# Patient Record
Sex: Male | Born: 1970 | Race: White | Hispanic: No | Marital: Married | Smoking: Never smoker
Health system: Southern US, Community
[De-identification: ages and names within clinical notes are randomized; demographics above are authoritative.]

## PROBLEM LIST (undated history)

## (undated) DIAGNOSIS — R55 Syncope and collapse: Secondary | ICD-10-CM

## (undated) DIAGNOSIS — E291 Testicular hypofunction: Secondary | ICD-10-CM

## (undated) DIAGNOSIS — F32A Depression, unspecified: Secondary | ICD-10-CM

## (undated) DIAGNOSIS — E669 Obesity, unspecified: Secondary | ICD-10-CM

## (undated) DIAGNOSIS — F329 Major depressive disorder, single episode, unspecified: Secondary | ICD-10-CM

## (undated) DIAGNOSIS — M7710 Lateral epicondylitis, unspecified elbow: Secondary | ICD-10-CM

## (undated) DIAGNOSIS — G4733 Obstructive sleep apnea (adult) (pediatric): Secondary | ICD-10-CM

## (undated) DIAGNOSIS — R079 Chest pain, unspecified: Secondary | ICD-10-CM

## (undated) HISTORY — DX: Obstructive sleep apnea (adult) (pediatric): G47.33

## (undated) HISTORY — DX: Chest pain, unspecified: R07.9

## (undated) HISTORY — DX: Major depressive disorder, single episode, unspecified: F32.9

## (undated) HISTORY — DX: Depression, unspecified: F32.A

## (undated) HISTORY — DX: Syncope and collapse: R55

## (undated) HISTORY — DX: Obesity, unspecified: E66.9

## (undated) HISTORY — DX: Testicular hypofunction: E29.1

## (undated) HISTORY — DX: Lateral epicondylitis, unspecified elbow: M77.10

---

## 2010-09-29 ENCOUNTER — Emergency Department (HOSPITAL_COMMUNITY): Payer: 59

## 2010-09-29 ENCOUNTER — Emergency Department (HOSPITAL_COMMUNITY)
Admission: EM | Admit: 2010-09-29 | Discharge: 2010-09-29 | Disposition: A | Discharge status: Home / Self Care | Payer: 59 | Attending: Emergency Medicine | Admitting: Emergency Medicine

## 2010-09-29 DIAGNOSIS — R55 Syncope and collapse: Secondary | ICD-10-CM | POA: Insufficient documentation

## 2010-09-29 LAB — CBC
MCHC: 33.7 g/dL (ref 30.0–36.0)
MCV: 85.3 fL (ref 78.0–100.0)
Platelets: 168 10*3/uL (ref 150–400)
RDW: 13.8 % (ref 11.5–15.5)
WBC: 7.9 10*3/uL (ref 4.0–10.5)

## 2010-09-29 LAB — URINALYSIS, ROUTINE W REFLEX MICROSCOPIC
Bilirubin Urine: NEGATIVE
Hgb urine dipstick: NEGATIVE
Ketones, ur: NEGATIVE mg/dL
Nitrite: NEGATIVE
Specific Gravity, Urine: 1.017 (ref 1.005–1.030)
Urine Glucose, Fasting: NEGATIVE mg/dL
pH: 6 (ref 5.0–8.0)

## 2010-09-29 LAB — DIFFERENTIAL
Basophils Absolute: 0 10*3/uL (ref 0.0–0.1)
Eosinophils Absolute: 0.1 10*3/uL (ref 0.0–0.7)
Eosinophils Relative: 1 % (ref 0–5)
Lymphs Abs: 1.3 10*3/uL (ref 0.7–4.0)
Monocytes Absolute: 0.6 10*3/uL (ref 0.1–1.0)

## 2010-09-29 LAB — BASIC METABOLIC PANEL
BUN: 9 mg/dL (ref 6–23)
Calcium: 8.7 mg/dL (ref 8.4–10.5)
GFR calc non Af Amer: >60 mL/min (ref >60)
Glucose, Bld: 125 mg/dL — ABNORMAL HIGH (ref 70–99)
Potassium: 4.1 mEq/L (ref 3.5–5.1)

## 2010-09-29 LAB — POCT CARDIAC MARKERS: Troponin i, poc: <0.05 ng/mL (ref 0.00–0.09)

## 2010-09-29 IMAGING — CT CT HEAD W/O CM
2 series · 16 of 30 positions shown, 20 images · non-contrast
Comparison: None

CLINICAL DATA: Syncope

CT HEAD WITHOUT CONTRAST
TECHNIQUE: Contiguous axial images were obtained from the base of
the skull through the vertex without contrast.

[Series 2: head w/o · axial · non-contrast · 0.52mm/px · z∈[+750,+880]mm · 13 of 32 slices shown, 17 images]
[im 3/32  brain]
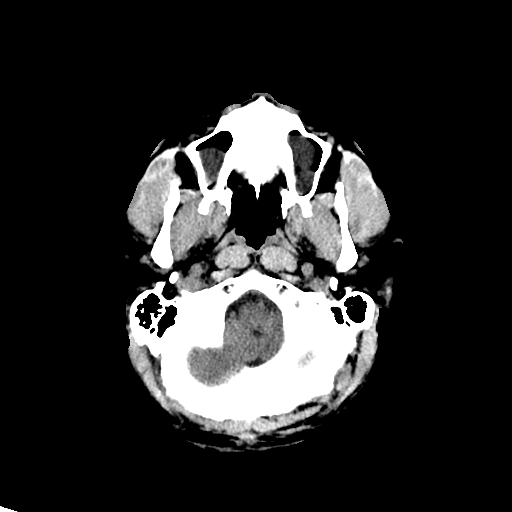
[im 3/32  bone]
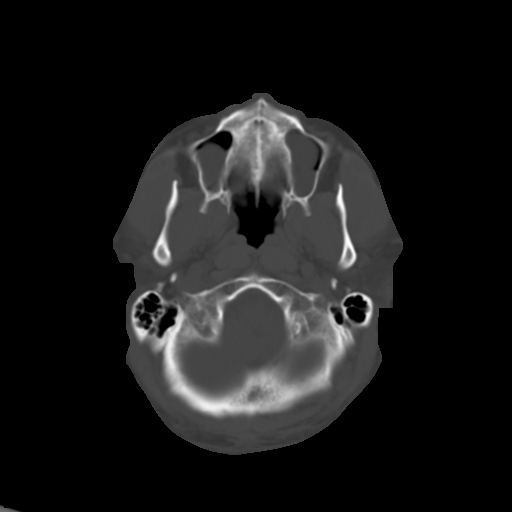
[im 5/32  brain]
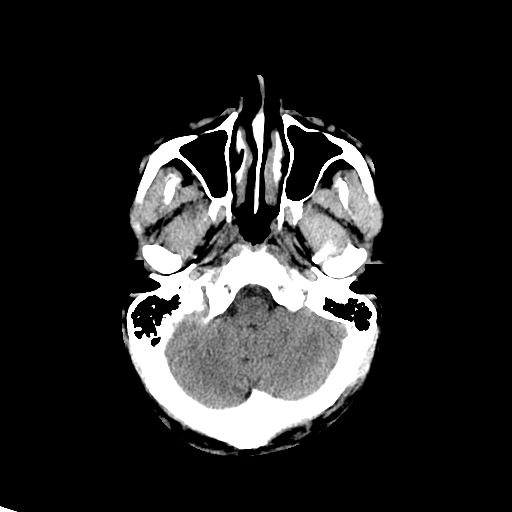
[im 7/32  brain]
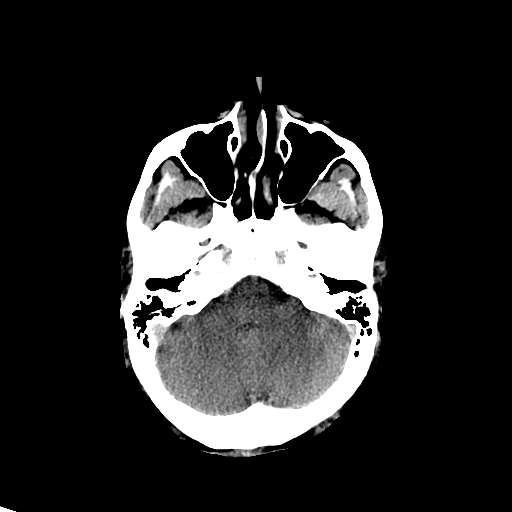
[im 9/32  brain]
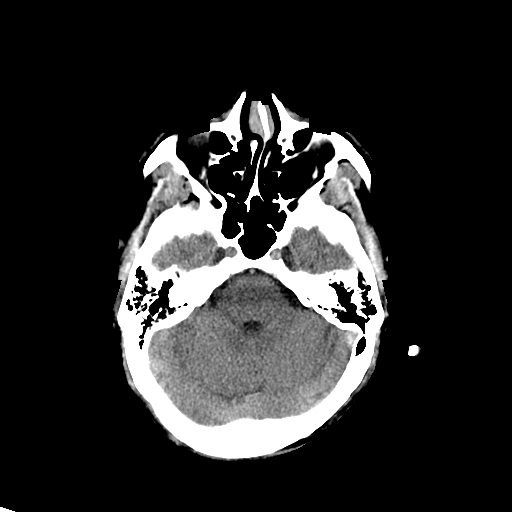
[im 12/32  brain]
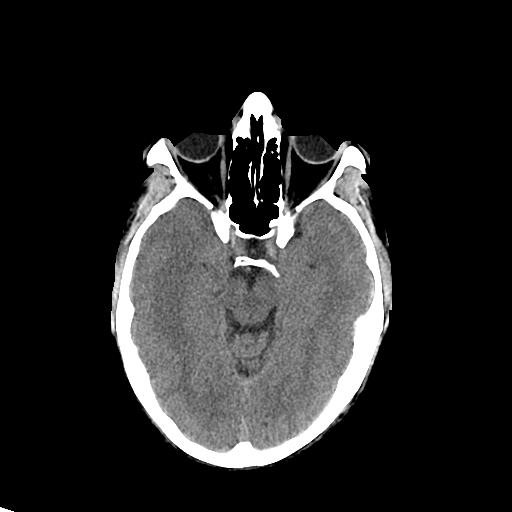
[im 12/32  bone]
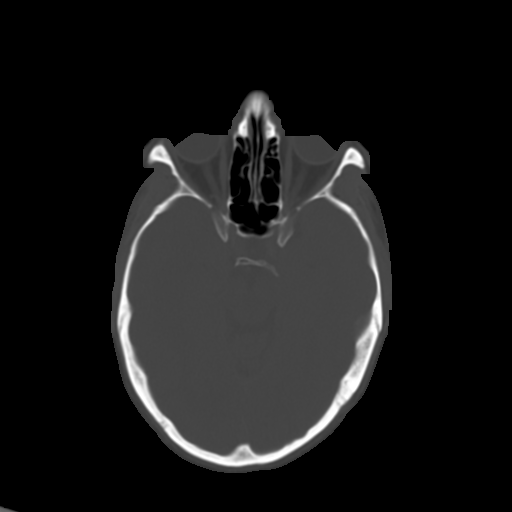
[im 14/32  brain]
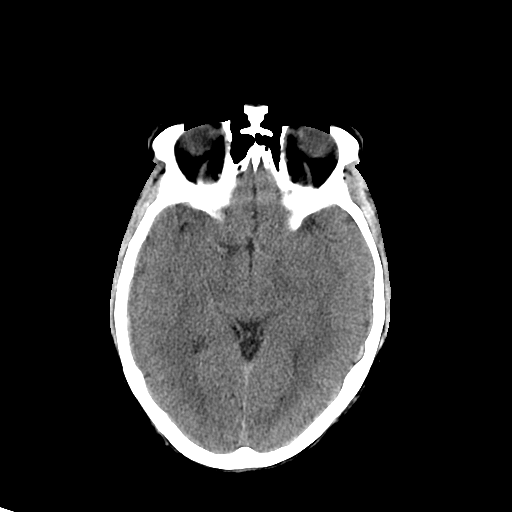
[im 16/32  brain]
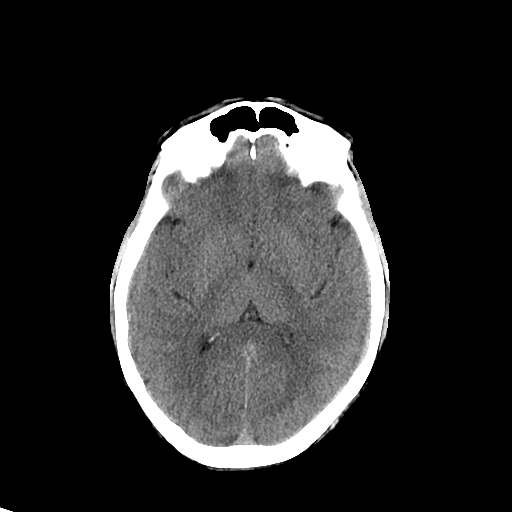
[im 18/32  brain]
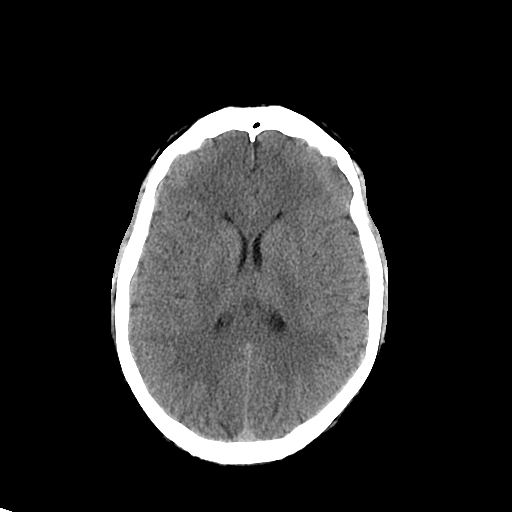
[im 20/32  brain]
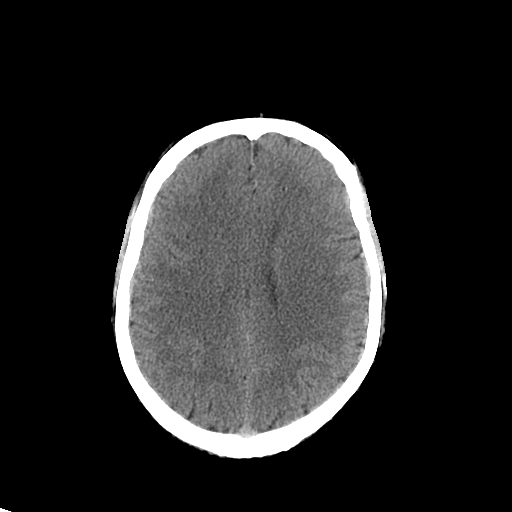
[im 20/32  bone]
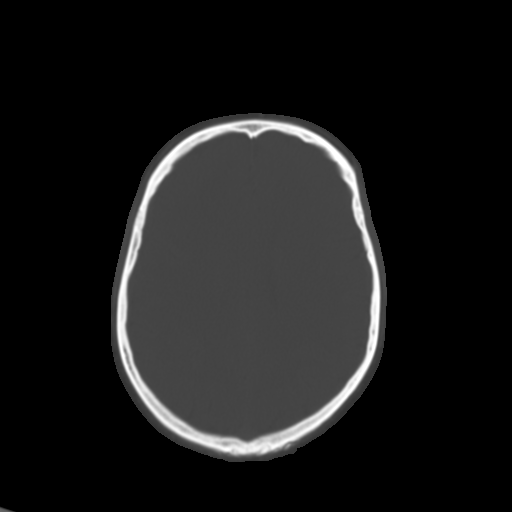
[im 23/32  brain]
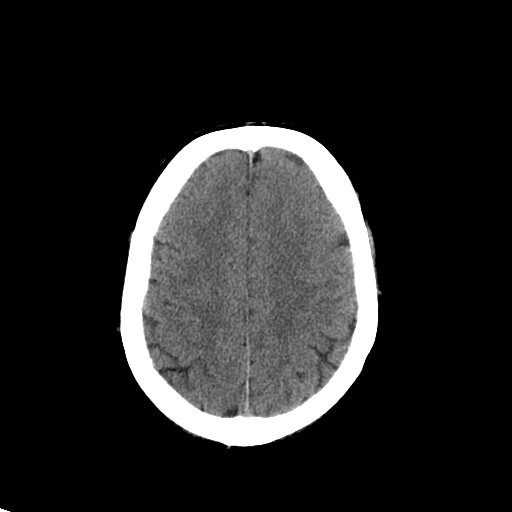
[im 25/32  brain]
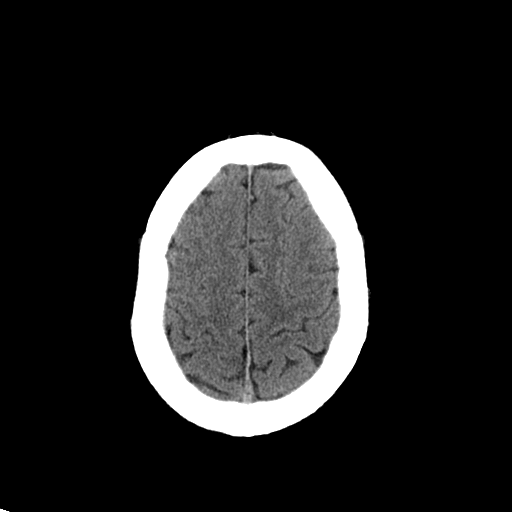
[im 27/32  brain]
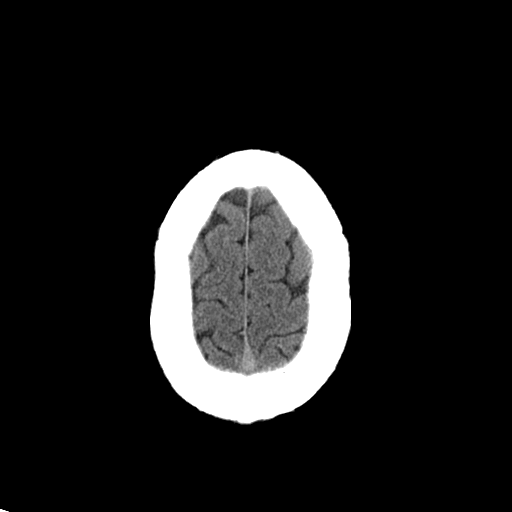
[im 29/32  brain]
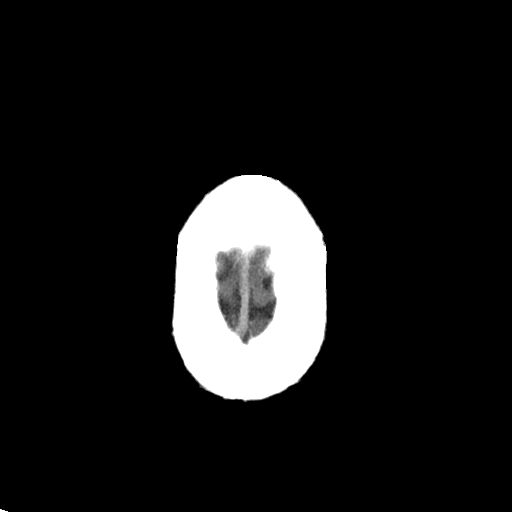
[im 29/32  bone]
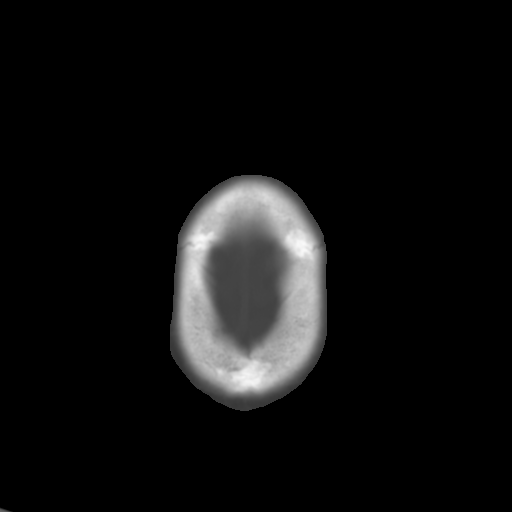

[Series 3: head w/o bone · axial · non-contrast · 0.52mm/px · z∈[+750,+795]mm · 3 of 32 slices shown]
[im 3/32  bone]
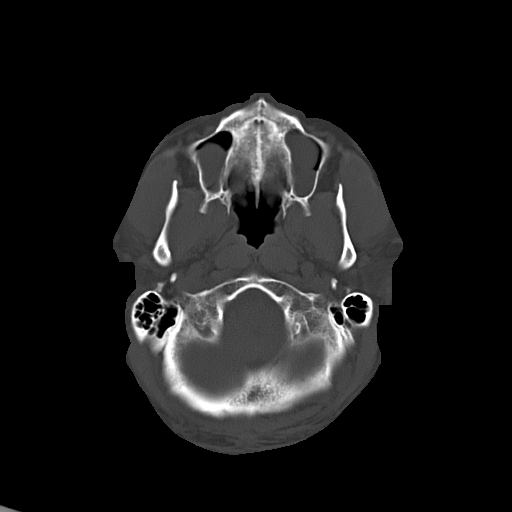
[im 7/32  bone]
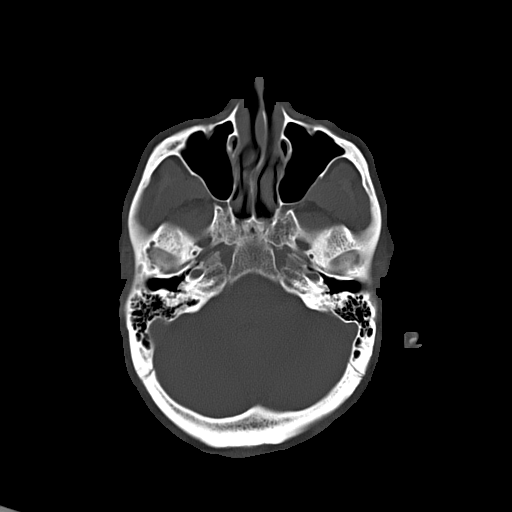
[im 12/32  bone]
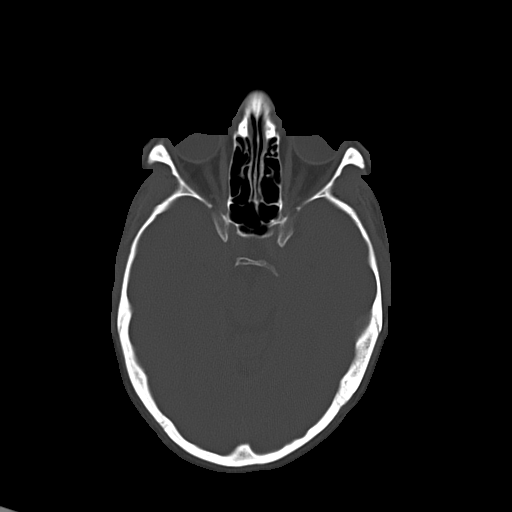

[16 of 30 positions shown; findings below may reference images not displayed]

FINDINGS: The brain has a normal appearance without evidence of
atrophy, old or acute infarction, mass lesion, hemorrhage,
hydrocephalus or extra-axial collection.  The calvarium is
unremarkable.  Sinuses, middle ears and mastoids are clear.
IMPRESSION: Normal head CT

## 2011-10-28 DIAGNOSIS — E291 Testicular hypofunction: Secondary | ICD-10-CM | POA: Insufficient documentation

## 2012-04-09 ENCOUNTER — Encounter: Payer: Self-pay | Admitting: Family

## 2012-04-09 ENCOUNTER — Ambulatory Visit (INDEPENDENT_AMBULATORY_CARE_PROVIDER_SITE_OTHER): Payer: BC Managed Care – PPO | Admitting: Family

## 2012-04-09 VITALS — BP 100/70 | HR 80 | Temp 98.2°F | Ht 73.25 in | Wt 275.0 lb

## 2012-04-09 DIAGNOSIS — R5383 Other fatigue: Secondary | ICD-10-CM

## 2012-04-09 DIAGNOSIS — G4700 Insomnia, unspecified: Secondary | ICD-10-CM

## 2012-04-09 DIAGNOSIS — R5381 Other malaise: Secondary | ICD-10-CM

## 2012-04-09 LAB — CBC WITH DIFFERENTIAL/PLATELET
Basophils Absolute: 0 10*3/uL (ref 0.0–0.1)
Eosinophils Relative: 2.1 % (ref 0.0–5.0)
HCT: 46.5 % (ref 39.0–52.0)
Hemoglobin: 15.3 g/dL (ref 13.0–17.0)
Lymphocytes Relative: 32.7 % (ref 12.0–46.0)
Lymphs Abs: 2.4 10*3/uL (ref 0.7–4.0)
Monocytes Relative: 6.8 % (ref 3.0–12.0)
Neutro Abs: 4.2 10*3/uL (ref 1.4–7.7)
RBC: 5.4 Mil/uL (ref 4.22–5.81)
RDW: 14.3 % (ref 11.5–14.6)
WBC: 7.3 10*3/uL (ref 4.5–10.5)

## 2012-04-09 LAB — BASIC METABOLIC PANEL
BUN: 11 mg/dL (ref 6–23)
CO2: 29 mEq/L (ref 19–32)
Chloride: 104 mEq/L (ref 96–112)
Creatinine, Ser: 1 mg/dL (ref 0.4–1.5)
Glucose, Bld: 83 mg/dL (ref 70–99)
Potassium: 4.1 mEq/L (ref 3.5–5.1)

## 2012-04-09 LAB — LIPID PANEL
Total CHOL/HDL Ratio: 5
VLDL: 51.8 mg/dL — ABNORMAL HIGH (ref 0.0–40.0)

## 2012-04-09 NOTE — Patient Instructions (Addendum)

## 2012-04-09 NOTE — Progress Notes (Signed)
  Subjective:    Patient ID: Joe Francis, male    DOB: Oct 05, 1970, 41 y.o.   MRN: [id number]  HPI 41 year old WM is in today with c/o being moody, fatigue, decreased sexual desire, and insomnia x several months. His wife made this appointment. He has not has a CPX in over 13 years. He has a family history of DM. Patient reports difficulty completing an orgasm. He can achieve an erection but cannot maintain.    Review of Systems  Constitutional: Positive for fatigue.  HENT: Negative.   Respiratory: Negative.   Cardiovascular: Negative.   Gastrointestinal: Negative.   Genitourinary: Negative.        Erectile dysfunction  Musculoskeletal: Negative.   Skin: Negative.   Neurological: Negative.   Hematological: Negative.   Psychiatric/Behavioral: Negative.    Past Medical History  Diagnosis Date  . Fainting spell     History   Social History  . Marital Status: Married    Spouse Name: N/A    Number of Children: N/A  . 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 Smoking status: Never Smoker   . Smokeless tobacco: Not on file  . Alcohol Use: Yes  . Drug Use: Not on file  . Sexually Active: Not on file   Other Topics Concern  . Not on file   Social History Narrative   5-6 hours of sleep per night3 people living in the residence    No past surgical history on file.  Family History  Problem Relation Age of Onset  . Miscarriages / [place] Mother   . Cancer Mother   . Depression Father   . Diabetes Father   . Drug abuse Brother   . Hearing loss Brother   . Learning disabilities Brother   . Stroke Brother   . Cancer Maternal Grandfather   . Arthritis Paternal Grandmother     Not on File  No current outpatient prescriptions on file prior to visit.    BP 100/70  Pulse 80  Temp 98.2 F (36.8 C) (Oral)  Ht 6' 1.25" (1.861 m)  Wt 275 lb (124.739 kg)  BMI 36.03 kg/m2  SpO2 97%chart    Objective:     Physical Exam  Constitutional: He is oriented to person, place, and time. He appears well-developed and well-nourished.  HENT:  Right Ear: External ear normal.  Left Ear: External ear normal.  Nose: Nose normal.  Mouth/Throat: Oropharynx is clear and moist.  Eyes: Conjunctivae are normal. Pupils are equal, round, and reactive to light.  Neck: Normal range of motion. Neck supple.  Cardiovascular: Normal rate, regular rhythm and normal heart sounds.   Pulmonary/Chest: Effort normal and breath sounds normal.  Abdominal: Soft. Bowel sounds are normal.  Musculoskeletal: Normal range of motion.  Neurological: He is alert and oriented to person, place, and time.  Skin: Skin is warm and dry.  Psychiatric: He has a normal mood and affect.          Assessment & Plan:  Assessment: Fatigue, Erectile Dysfunction  Plan: Labs sent to include BMP, lipids, TSH, CBC. Encouraged a healthy diet and exercise. Patient will return for CPX.

## 2012-04-13 ENCOUNTER — Other Ambulatory Visit: Payer: Self-pay | Admitting: Family Medicine

## 2012-04-13 DIAGNOSIS — E7889 Other lipoprotein metabolism disorders: Secondary | ICD-10-CM

## 2012-05-22 ENCOUNTER — Encounter: Payer: Self-pay | Admitting: Family

## 2012-05-22 ENCOUNTER — Other Ambulatory Visit: Payer: Self-pay | Admitting: Family

## 2012-05-22 ENCOUNTER — Ambulatory Visit (INDEPENDENT_AMBULATORY_CARE_PROVIDER_SITE_OTHER): Payer: BC Managed Care – PPO | Admitting: Family

## 2012-05-22 ENCOUNTER — Other Ambulatory Visit: Payer: Self-pay

## 2012-05-22 VITALS — BP 110/80 | HR 78 | Temp 98.1°F | Ht 72.25 in | Wt 275.0 lb

## 2012-05-22 DIAGNOSIS — R5383 Other fatigue: Secondary | ICD-10-CM

## 2012-05-22 DIAGNOSIS — F529 Unspecified sexual dysfunction not due to a substance or known physiological condition: Secondary | ICD-10-CM

## 2012-05-22 DIAGNOSIS — Z0000 Encounter for general adult medical examination without abnormal findings: Secondary | ICD-10-CM

## 2012-05-22 DIAGNOSIS — F5231 Female orgasmic disorder: Secondary | ICD-10-CM

## 2012-05-22 DIAGNOSIS — R5381 Other malaise: Secondary | ICD-10-CM

## 2012-05-22 DIAGNOSIS — E785 Hyperlipidemia, unspecified: Secondary | ICD-10-CM

## 2012-05-22 DIAGNOSIS — Z23 Encounter for immunization: Secondary | ICD-10-CM

## 2012-05-22 LAB — LIPID PANEL
Cholesterol: 196 mg/dL (ref 0–200)
LDL Cholesterol: 125 mg/dL — ABNORMAL HIGH (ref 0–99)
Triglycerides: 184 mg/dL — ABNORMAL HIGH (ref 0.0–149.0)
VLDL: 36.8 mg/dL (ref 0.0–40.0)

## 2012-05-22 LAB — TESTOSTERONE: Testosterone: 199.16 ng/dL — ABNORMAL LOW (ref 350.00–890.00)

## 2012-05-22 MED ORDER — TESTOSTERONE 20.25 MG/1.25GM (1.62%) TD GEL: 2.0000 | Freq: Every day | TRANSDERMAL | Status: DC

## 2012-05-22 NOTE — Addendum Note (Signed)
Addended by: Beverely Low on: 05/22/2012 09:38 AM   Modules accepted: Orders

## 2012-05-22 NOTE — Progress Notes (Signed)
Subjective:    Patient ID: Joe Francis, male    DOB: 08-Nov-1970, 41 y.o.   MRN: [id number]  HPI Patient presents for yearly preventative medicine examination. All immunizations and health maintenance protocols were reviewed with the patient and they are up to date with these protocols. Screening laboratory values were reviewed with the patient including screening of hyperlipidemia PSA renal function and hepatic function. There medications past medical history social history problem list and allergies were reviewed in detail. Goals were established with regard to weight loss exercise diet in compliance with medications  Patient c/o fatigue and difficulty completing and erection. He is growing frustrated with this. His symptoms have not improved.   Review of Systems  Constitutional: Positive for fatigue.  HENT: Negative.   Eyes: Negative.   Respiratory: Negative.   Cardiovascular: Negative.   Gastrointestinal: Negative.   Genitourinary: Negative.   Musculoskeletal: Negative.   Skin: Negative.   Neurological: Negative.   Hematological: Negative.   Psychiatric/Behavioral: Negative.    Past Medical History  Diagnosis Date  . Fainting spell     History   Social History  . Marital Status: Married    Spouse Name: N/A    Number of Children: N/A  . 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 Smoking status: Never Smoker   . Smokeless tobacco: Not on file  . Alcohol Use: Yes  . Drug Use: Not on file  . Sexually Active: Not on file   Other Topics Concern  . Not on file   Social History Narrative   5-6 hours of sleep per night3 people living in the residence    No past surgical history on file.  Family History  Problem Relation Age of Onset  . Miscarriages / [place] Mother   . Cancer Mother   . Depression Father   . Diabetes Father   . Drug abuse Brother   . Hearing loss Brother   . Learning disabilities  Brother   . Stroke Brother   . Cancer Maternal Grandfather   . Arthritis Paternal Grandmother     Not on File  No current outpatient prescriptions on file prior to visit.    BP 110/80  Pulse 78  Temp 98.1 F (36.7 C) (Oral)  Ht 6' 0.25" (1.835 m)  Wt 275 lb (124.739 kg)  BMI 37.04 kg/m2  SpO2 93%chart    Objective:   Physical Exam  Constitutional: He is oriented to person, place, and time. He appears well-developed and well-nourished.  HENT:  Head: Normocephalic and atraumatic.  Right Ear: External ear normal.  Left Ear: External ear normal.  Nose: Nose normal.  Mouth/Throat: Oropharynx is clear and moist.  Eyes: Conjunctivae normal and EOM are normal. Pupils are equal, round, and reactive to light.  Neck: Normal range of motion. Neck supple. No thyromegaly present.  Cardiovascular: Normal rate, regular rhythm, normal heart sounds and intact distal pulses.  Exam reveals no gallop and no friction rub.   No murmur heard. Pulmonary/Chest: Effort normal and breath sounds normal.  Abdominal: Soft. Bowel sounds are normal.  Genitourinary: Rectum normal, prostate normal and penis normal. Guaiac negative stool. No penile tenderness.  Musculoskeletal: Normal range of motion.  Neurological: He is alert and oriented to person, place, and time. He displays normal reflexes. No cranial nerve deficit. Coordination normal.  Skin: Skin is warm and dry.  Psychiatric: He has a normal mood and affect.  Assessment & Plan:  Assessment: CPX, Fatigue, Orgasm Disorder  Plan: Labs sent to include lipids, testosterone. Encouraged a healthy diet and exercise.

## 2012-06-29 ENCOUNTER — Telehealth: Payer: Self-pay | Admitting: Family

## 2012-06-29 DIAGNOSIS — E291 Testicular hypofunction: Secondary | ICD-10-CM

## 2012-06-29 NOTE — Telephone Encounter (Signed)
Pt called and said that the Testosterone (ANDROGEL) 20.25 MG/1.25GM (1.62%) GEL doesn't seem to be working. Pt wondering if he needs to come in to get lvls checked?

## 2012-06-29 NOTE — Telephone Encounter (Signed)
Please advise 

## 2012-06-30 NOTE — Telephone Encounter (Signed)
Yes please schedule for patient to have testosterone checked.

## 2012-06-30 NOTE — Telephone Encounter (Signed)
appt scheduled

## 2012-07-06 ENCOUNTER — Other Ambulatory Visit: Payer: Self-pay | Admitting: Family

## 2012-07-06 ENCOUNTER — Other Ambulatory Visit (INDEPENDENT_AMBULATORY_CARE_PROVIDER_SITE_OTHER): Payer: BC Managed Care – PPO

## 2012-07-06 DIAGNOSIS — E291 Testicular hypofunction: Secondary | ICD-10-CM

## 2012-07-06 MED ORDER — TESTOSTERONE CYPIONATE 200 MG/ML IM SOLN: 200.0000 mg | INTRAMUSCULAR | Status: DC

## 2012-07-07 ENCOUNTER — Telehealth: Payer: Self-pay | Admitting: Family

## 2012-07-07 NOTE — Telephone Encounter (Signed)
Pt called and said that he was returning call from yesterday re: lab results. Pls call.

## 2012-07-07 NOTE — Telephone Encounter (Signed)
See result note.  

## 2012-07-09 ENCOUNTER — Telehealth: Payer: Self-pay | Admitting: Family Medicine

## 2012-07-09 NOTE — Telephone Encounter (Signed)
Left message to notify pt, per Padonda, pt can do 100mg  q 7 days instead of 200mg  q 14 days. Advised pt to call with questions or concerns

## 2012-07-09 NOTE — Telephone Encounter (Signed)
Doug's testosterone injection are approved. He is wondering if he can inject 100mg  every 7 days, instead of 200mg  every 14 days. He didn't know if that would level in out in a more steady manner. Please call him and let him know. Thank you.

## 2012-08-28 ENCOUNTER — Encounter: Payer: Self-pay | Admitting: Family

## 2012-08-28 ENCOUNTER — Ambulatory Visit (INDEPENDENT_AMBULATORY_CARE_PROVIDER_SITE_OTHER): Payer: BC Managed Care – PPO | Admitting: Family

## 2012-08-28 VITALS — BP 108/80 | HR 81 | Wt 282.0 lb

## 2012-08-28 DIAGNOSIS — E291 Testicular hypofunction: Secondary | ICD-10-CM

## 2012-08-28 DIAGNOSIS — R5383 Other fatigue: Secondary | ICD-10-CM

## 2012-08-28 DIAGNOSIS — N529 Male erectile dysfunction, unspecified: Secondary | ICD-10-CM

## 2012-08-28 LAB — COMPREHENSIVE METABOLIC PANEL
ALT: 30 U/L (ref 0–53)
CO2: 26 mEq/L (ref 19–32)
Calcium: 9.1 mg/dL (ref 8.4–10.5)
Chloride: 106 mEq/L (ref 96–112)
GFR: 72.88 mL/min (ref >60.00)
Glucose, Bld: 97 mg/dL (ref 70–99)
Sodium: 140 mEq/L (ref 135–145)
Total Protein: 7.6 g/dL (ref 6.0–8.3)

## 2012-08-28 LAB — TESTOSTERONE: Testosterone: 438.43 ng/dL (ref 350.00–890.00)

## 2012-08-28 MED ORDER — TESTOSTERONE CYPIONATE 200 MG/ML IM SOLN: 200.0000 mg | INTRAMUSCULAR | Status: DC

## 2012-08-28 NOTE — Progress Notes (Signed)
  Subjective:    Patient ID: Joe Francis, male    DOB: 1970-11-04, 42 y.o.   MRN: [id number]  HPI 42 year old WM, nonsmoker, is in for recheck of hypogonadism. Patient has improved overall. He has taken 100 mg of testosterone once a week on Sundays. By Tuesday, patient says that he feels the best and is able to perform sexually well by Tuesday. By Thursday he is unable to perform again until his next dosage. He has difficulty achieving orgasm most of the time. But that has improved from never. Continues to have fatigue that is also significantly improved.   Review of Systems  Constitutional: Positive for fatigue.  HENT: Negative.   Respiratory: Negative.   Cardiovascular: Negative.   Gastrointestinal: Negative.   Genitourinary: Negative.        Erectile dysfunction   Musculoskeletal: Negative.   Skin: Negative.   Neurological: Negative.   Psychiatric/Behavioral: Negative.    Past Medical History  Diagnosis Date  . Fainting spell     History   Social History  . Marital Status: Married    Spouse Name: N/A    Number of Children: N/A  . 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 Smoking status: Never Smoker   . Smokeless tobacco: Not on file  . Alcohol Use: Yes  . Drug Use: Not on file  . Sexually Active: Not on file   Other Topics Concern  . Not on file   Social History Narrative   5-6 hours of sleep per night3 people living in the residence    No past surgical history on file.  Family History  Problem Relation Age of Onset  . Miscarriages / [place] Mother   . Cancer Mother   . Depression Father   . Diabetes Father   . Drug abuse Brother   . Hearing loss Brother   . Learning disabilities Brother   . Stroke Brother   . Cancer Maternal Grandfather   . Arthritis Paternal Grandmother     Not on File  Current Outpatient Prescriptions on File Prior to Visit  Medication Sig Dispense Refill  .  testosterone cypionate (DEPOTESTOTERONE CYPIONATE) 200 MG/ML injection Inject 1 mL (200 mg total) into the muscle once a week.  10 mL  0    BP 108/80  Pulse 81  Wt 282 lb (127.914 kg)  SpO2 98%chart    Objective:   Physical Exam  Constitutional: He is oriented to person, place, and time. He appears well-developed and well-nourished.  Neck: Normal range of motion. Neck supple.  Cardiovascular: Normal rate, regular rhythm and normal heart sounds.   Pulmonary/Chest: Effort normal and breath sounds normal.  Abdominal: Soft. Bowel sounds are normal.  Musculoskeletal: Normal range of motion.  Neurological: He is alert and oriented to person, place, and time.  Skin: Skin is warm and dry.  Psychiatric: He has a normal mood and affect.          Assessment & Plan:  Assessment: Hypogonadism, fatigue, erectile dysfunction  Plan: Testosterone level sent. Consider increasing testosterone to 200 mg once a week x4 weeks then biweekly Contingent upon the results of his labs. we'll keep a close eye on his testosterone since he's had difficulty for several months.

## 2012-08-28 NOTE — Patient Instructions (Addendum)
Testosterone This test is used to determine if your testosterone level is abnormal. This could be used to explain difficulty getting an erection (erectile dysfunction), inability of your partner to get pregnant (infertility), premature or delayed puberty if you are male, or the appearance of masculine physical features if you are male. PREPARATION FOR TEST A blood sample is obtained by inserting a needle into a vein in the arm. NORMAL FINDINGS  Free Testosterone: 0.3-2 pg/mL  % Free Testosterone: 0.1%-0.3% Total Testosterone:  7 mos-9 yrs (Tanner Stage I)  Male: Less than 30 ng/dL  Male: Less than 30 ng/dL  16-10 yrs (Tanner Stage II)  Male: Less than 300 ng/dL  Male: Less than 40 ng/dL  96-04 yrs (Tanner Stage III)  Male: 170-540 ng/dL  Male: Less than 60 ng/dL  54-09 yrs (Tanner Stage IV, V)  Male: 250-910 ng/dL  Male: Less than 70 ng/dL  20 yrs and over  Male: [phone] ng/dL  Male: Less than 70 ng/dL Ranges for normal findings may vary among different laboratories and hospitals. You should always check with your doctor after having lab work or other tests done to discuss the meaning of your test results and whether your values are considered within normal limits. MEANING OF TEST  Your caregiver will go over the test results with you and discuss the importance and meaning of your results, as well as treatment options and the need for additional tests if necessary. OBTAINING THE TEST RESULTS It is your responsibility to obtain your test results. Ask the lab or department performing the test when and how you will get your results. Document Released: 08/29/2004 Document Revised: 11/04/2011 Document Reviewed: 07/24/2008 [facility] Patient Information 2013 [place], [place].

## 2012-10-09 ENCOUNTER — Other Ambulatory Visit: Payer: BC Managed Care – PPO

## 2012-10-12 ENCOUNTER — Other Ambulatory Visit (INDEPENDENT_AMBULATORY_CARE_PROVIDER_SITE_OTHER): Payer: BC Managed Care – PPO

## 2012-10-12 DIAGNOSIS — R5383 Other fatigue: Secondary | ICD-10-CM

## 2012-10-12 DIAGNOSIS — E789 Disorder of lipoprotein metabolism, unspecified: Secondary | ICD-10-CM

## 2012-10-12 DIAGNOSIS — E7889 Other lipoprotein metabolism disorders: Secondary | ICD-10-CM

## 2012-10-12 DIAGNOSIS — N529 Male erectile dysfunction, unspecified: Secondary | ICD-10-CM

## 2012-10-12 DIAGNOSIS — E291 Testicular hypofunction: Secondary | ICD-10-CM

## 2012-10-12 LAB — TESTOSTERONE: Testosterone: 891.87 ng/dL — ABNORMAL HIGH (ref 350.00–890.00)

## 2012-10-12 LAB — LIPID PANEL: Total CHOL/HDL Ratio: 5

## 2012-10-27 ENCOUNTER — Encounter: Payer: Self-pay | Admitting: Family

## 2012-10-27 ENCOUNTER — Ambulatory Visit (INDEPENDENT_AMBULATORY_CARE_PROVIDER_SITE_OTHER): Payer: BC Managed Care – PPO | Admitting: Family

## 2012-10-27 VITALS — BP 120/80 | HR 64 | Wt 288.0 lb

## 2012-10-27 DIAGNOSIS — M7711 Lateral epicondylitis, right elbow: Secondary | ICD-10-CM

## 2012-10-27 DIAGNOSIS — M7710 Lateral epicondylitis, unspecified elbow: Secondary | ICD-10-CM

## 2012-10-27 DIAGNOSIS — E291 Testicular hypofunction: Secondary | ICD-10-CM

## 2012-10-27 MED ORDER — METHYLPREDNISOLONE 4 MG PO KIT: PACK | ORAL | Status: AC

## 2012-10-27 MED ORDER — TESTOSTERONE CYPIONATE 200 MG/ML IM SOLN: 100.0000 mg | INTRAMUSCULAR | Status: DC

## 2012-10-27 NOTE — Progress Notes (Signed)
  Subjective:    Patient ID: Joe Francis, male    DOB: [birth year], 42 y.o.   MRN: [id number]  HPI 42 year old white male, nonsmoker is in today with bilateral elbow pain x2 weeks. He denies any increase in repetitive movement. However, he does weight lift every day. Reports bench pressing 275 pounds tricep press 90 pounds. Reports pain 6-7/10, worse with movement. Has been taken over-the-counter Motrin that has not relieved the pain. Denies any changes in his exercise regimen.  Patient currently doing well on testosterone injections. Reports feeling significantly better.  Review of Systems  Constitutional: Negative.   Respiratory: Negative.   Cardiovascular: Negative.   Gastrointestinal: Negative.   Musculoskeletal: Positive for joint swelling.       Elbow pain  Skin: Negative.   Neurological: Negative.   Hematological: Negative.   Psychiatric/Behavioral: Negative.    Past Medical History  Diagnosis Date  . Fainting spell     History   Social History  . Marital Status: Married    Spouse Name: N/A    Number of Children: N/A  . 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 Smoking status: Never Smoker   . Smokeless tobacco: Not on file  . Alcohol Use: Yes  . Drug Use: Not on file  . Sexually Active: Not on file   Other Topics Concern  . Not on file   Social History Narrative   5-6 hours of sleep per night   3 people living in the residence    History reviewed. No pertinent past surgical history.  Family History  Problem Relation Age of Onset  . Miscarriages / [place] Mother   . Cancer Mother   . Depression Father   . Diabetes Father   . Drug abuse Brother   . Hearing loss Brother   . Learning disabilities Brother   . Stroke Brother   . Cancer Maternal Grandfather   . Arthritis Paternal Grandmother     Not on File  No current outpatient prescriptions on file prior to visit.   No current  facility-administered medications on file prior to visit.    BP 120/80  Pulse 64  Wt 288 lb (130.636 kg)  BMI 38.8 kg/m2  SpO2 98%chart    Objective:   Physical Exam  Constitutional: He is oriented to person, place, and time. He appears well-developed and well-nourished.  Neck: Normal range of motion. Neck supple.  Cardiovascular: Normal rate, regular rhythm and normal heart sounds.   Pulmonary/Chest: Effort normal and breath sounds normal.  Musculoskeletal: He exhibits tenderness.  Left elbow-moderate tenderness to palpation of the lateral epicondyle. Mild swelling. Right elbow mild tenderness to palpation of the lateral epicondyle. No swelling.  Neurological: He is alert and oriented to person, place, and time.  Skin: Skin is warm and dry.  Psychiatric: He has a normal mood and affect.          Assessment & Plan:  Assessment:  1. Tennis elbow bilaterally 2. Hypogonadism  Plan: Medrol Dosepak as directed. Rest x1 week. Ice to both elbows nightly. Call the office if symptoms worsen or persist. Recheck as scheduled, and as needed.

## 2012-10-27 NOTE — Patient Instructions (Addendum)
Lateral Epicondylitis (Tennis Elbow) with Rehab Lateral epicondylitis involves inflammation and pain around the outer portion of the elbow. The pain is caused by inflammation of the tendons in the forearm that bring back (extend) the wrist. Lateral epicondylittis is also called tennis elbow, because it is very common in tennis players. However, it may occur in any individual who extends the wrist repetitively. If lateral epicondylitis is left untreated, it may become a chronic problem. SYMPTOMS   Pain, tenderness, and inflammation on the outer (lateral) side of the elbow.  Pain or weakness with gripping activities.  Pain that increases with wrist twisting motions (playing tennis, using a screwdriver, opening a door or a jar).  Pain with lifting objects, including a coffee cup. CAUSES  Lateral epicondylitis is caused by inflammation of the tendons that extend the wrist. Causes of injury may include:  Repetitive stress and strain on the muscles and tendons that extend the wrist.  Sudden change in activity level or intensity.  Incorrect grip in racquet sports.  Incorrect grip size of racquet (often too large).  Incorrect hitting position or technique (usually backhand, leading with the elbow).  Using a racket that is too heavy. RISK INCREASES WITH:  Sports or occupations that require repetitive and/or strenuous forearm and wrist movements (tennis, squash, racquetball, carpentry).  Poor wrist and forearm strength and flexibility.  Failure to warm up properly before activity.  Resuming activity before healing, rehabilitation, and conditioning are complete. PREVENTION   Warm up and stretch properly before activity.  Maintain physical fitness:  Strength, flexibility, and endurance.  Cardiovascular fitness.  Wear and use properly fitted equipment.  Learn and use proper technique and have a coach correct improper technique.  Wear a tennis elbow (counterforce) brace. PROGNOSIS   The course of this condition depends on the degree of the injury. If treated properly, acute cases (symptoms lasting less than 4 weeks) are often resolved in 2 to 6 weeks. Chronic (longer lasting cases) often resolve in 3 to 6 months, but may require physical therapy. RELATED COMPLICATIONS   Frequently recurring symptoms, resulting in a chronic problem. Properly treating the problem the first time decreases frequency of recurrence.  Chronic inflammation, scarring tendon degeneration, and partial tendon tear, requiring surgery.  Delayed healing or resolution of symptoms. TREATMENT  Treatment first involves the use of ice and medicine, to reduce pain and inflammation. Strengthening and stretching exercises may help reduce discomfort, if performed regularly. These exercises may be performed at home, if the condition is an acute injury. Chronic cases may require a referral to a physical therapist for evaluation and treatment. Your caregiver may advise a corticosteroid injection, to help reduce inflammation. Rarely, surgery is needed. MEDICATION  If pain medicine is needed, nonsteroidal anti-inflammatory medicines (aspirin and ibuprofen), or other minor pain relievers (acetaminophen), are often advised.  Do not take pain medicine for 7 days before surgery.  Prescription pain relievers may be given, if your caregiver thinks they are needed. Use only as directed and only as much as you need.  Corticosteroid injections may be recommended. These injections should be reserved only for the most severe cases, because they can only be given a certain number of times. HEAT AND COLD  Cold treatment (icing) should be applied for 10 to 15 minutes every 2 to 3 hours for inflammation and pain, and immediately after activity that aggravates your symptoms. Use ice packs or an ice massage.  Heat treatment may be used before performing stretching and strengthening activities prescribed by your   caregiver, physical  therapist, or athletic trainer. Use a heat pack or a warm water soak. SEEK MEDICAL CARE IF: Symptoms get worse or do not improve in 2 weeks, despite treatment. EXERCISES  RANGE OF MOTION (ROM) AND STRETCHING EXERCISES - Epicondylitis, Lateral (Tennis Elbow) These exercises may help you when beginning to rehabilitate your injury. Your symptoms may go away with or without further involvement from your physician, physical therapist or athletic trainer. While completing these exercises, remember:   Restoring tissue flexibility helps normal motion to return to the joints. This allows healthier, less painful movement and activity.  An effective stretch should be held for at least 30 seconds.  A stretch should never be painful. You should only feel a gentle lengthening or release in the stretched tissue. RANGE OF MOTION  Wrist Flexion, Active-Assisted  Extend your right / left elbow with your fingers pointing down.*  Gently pull the back of your hand towards you, until you feel a gentle stretch on the top of your forearm.  Hold this position for __________ seconds. Repeat __________ times. Complete this exercise __________ times per day.  *If directed by your physician, physical therapist or athletic trainer, complete this stretch with your elbow bent, rather than extended. RANGE OF MOTION  Wrist Extension, Active-Assisted  Extend your right / left elbow and turn your palm upwards.*  Gently pull your palm and fingertips back, so your wrist extends and your fingers point more toward the ground.  You should feel a gentle stretch on the inside of your forearm.  Hold this position for __________ seconds. Repeat __________ times. Complete this exercise __________ times per day. *If directed by your physician, physical therapist or athletic trainer, complete this stretch with your elbow bent, rather than extended. STRETCH - Wrist Flexion  Place the back of your right / left hand on a tabletop,  leaving your elbow slightly bent. Your fingers should point away from your body.  Gently press the back of your hand down onto the table by straightening your elbow. You should feel a stretch on the top of your forearm.  Hold this position for __________ seconds. Repeat __________ times. Complete this stretch __________ times per day.  STRETCH  Wrist Extension   Place your right / left fingertips on a tabletop, leaving your elbow slightly bent. Your fingers should point backwards.  Gently press your fingers and palm down onto the table by straightening your elbow. You should feel a stretch on the inside of your forearm.  Hold this position for __________ seconds. Repeat __________ times. Complete this stretch __________ times per day.  STRENGTHENING EXERCISES - Epicondylitis, Lateral (Tennis Elbow) These exercises may help you when beginning to rehabilitate your injury. They may resolve your symptoms with or without further involvement from your physician, physical therapist or athletic trainer. While completing these exercises, remember:   Muscles can gain both the endurance and the strength needed for everyday activities through controlled exercises.  Complete these exercises as instructed by your physician, physical therapist or athletic trainer. Increase the resistance and repetitions only as guided.  You may experience muscle soreness or fatigue, but the pain or discomfort you are trying to eliminate should never worsen during these exercises. If this pain does get worse, stop and make sure you are following the directions exactly. If the pain is still present after adjustments, discontinue the exercise until you can discuss the trouble with your caregiver. STRENGTH Wrist Flexors  Sit with your right / left forearm palm-up and   fully supported on a table or countertop. Your elbow should be resting below the height of your shoulder. Allow your wrist to extend over the edge of the  surface.  Loosely holding a __________ weight, or a piece of rubber exercise band or tubing, slowly curl your hand up toward your forearm.  Hold this position for __________ seconds. Slowly lower the wrist back to the starting position in a controlled manner. Repeat __________ times. Complete this exercise __________ times per day.  STRENGTH  Wrist Extensors  Sit with your right / left forearm palm-down and fully supported on a table or countertop. Your elbow should be resting below the height of your shoulder. Allow your wrist to extend over the edge of the surface.  Loosely holding a __________ weight, or a piece of rubber exercise band or tubing, slowly curl your hand up toward your forearm.  Hold this position for __________ seconds. Slowly lower the wrist back to the starting position in a controlled manner. Repeat __________ times. Complete this exercise __________ times per day.  STRENGTH - Ulnar Deviators  Stand with a ____________________ weight in your right / left hand, or sit while holding a rubber exercise band or tubing, with your healthy arm supported on a table or countertop.  Move your wrist, so that your pinkie travels toward your forearm and your thumb moves away from your forearm.  Hold this position for __________ seconds and then slowly lower the wrist back to the starting position. Repeat __________ times. Complete this exercise __________ times per day STRENGTH - Radial Deviators  Stand with a ____________________ weight in your right / left hand, or sit while holding a rubber exercise band or tubing, with your injured arm supported on a table or countertop.  Raise your hand upward in front of you or pull up on the rubber tubing.  Hold this position for __________ seconds and then slowly lower the wrist back to the starting position. Repeat __________ times. Complete this exercise __________ times per day. STRENGTH  Forearm Supinators   Sit with your right /  left forearm supported on a table, keeping your elbow below shoulder height. Rest your hand over the edge, palm down.  Gently grip a hammer or a soup ladle.  Without moving your elbow, slowly turn your palm and hand upward to a "thumbs-up" position.  Hold this position for __________ seconds. Slowly return to the starting position. Repeat __________ times. Complete this exercise __________ times per day.  STRENGTH  Forearm Pronators   Sit with your right / left forearm supported on a table, keeping your elbow below shoulder height. Rest your hand over the edge, palm up.  Gently grip a hammer or a soup ladle.  Without moving your elbow, slowly turn your palm and hand upward to a "thumbs-up" position.  Hold this position for __________ seconds. Slowly return to the starting position. Repeat __________ times. Complete this exercise __________ times per day.  STRENGTH - Grip  Grasp a tennis ball, a dense sponge, or a large, rolled sock in your hand.  Squeeze as hard as you can, without increasing any pain.  Hold this position for __________ seconds. Release your grip slowly. Repeat __________ times. Complete this exercise __________ times per day.  STRENGTH - Elbow Extensors, Isometric  Stand or sit upright, on a firm surface. Place your right / left arm so that your palm faces your stomach, and it is at the height of your waist.  Place your opposite hand on   the underside of your forearm. Gently push up as your right / left arm resists. Push as hard as you can with both arms, without causing any pain or movement at your right / left elbow. Hold this stationary position for __________ seconds. Gradually release the tension in both arms. Allow your muscles to relax completely before repeating. Document Released: 08/12/2005 Document Revised: 11/04/2011 Document Reviewed: 11/24/2008 Exit[employer] Patient Information 2013 [employer].  

## 2013-01-11 ENCOUNTER — Encounter: Payer: Self-pay | Admitting: Family

## 2013-01-11 ENCOUNTER — Ambulatory Visit (INDEPENDENT_AMBULATORY_CARE_PROVIDER_SITE_OTHER): Payer: BC Managed Care – PPO | Admitting: Family

## 2013-01-11 VITALS — BP 122/80 | HR 70 | Temp 97.9°F | Wt 280.0 lb

## 2013-01-11 DIAGNOSIS — R5381 Other malaise: Secondary | ICD-10-CM

## 2013-01-11 DIAGNOSIS — M7710 Lateral epicondylitis, unspecified elbow: Secondary | ICD-10-CM

## 2013-01-11 DIAGNOSIS — M7711 Lateral epicondylitis, right elbow: Secondary | ICD-10-CM

## 2013-01-11 DIAGNOSIS — E291 Testicular hypofunction: Secondary | ICD-10-CM

## 2013-01-11 LAB — TESTOSTERONE: Testosterone: 303.69 ng/dL — ABNORMAL LOW (ref 350.00–890.00)

## 2013-01-11 MED ORDER — DICLOFENAC SODIUM 75 MG PO TBEC: 75.0000 mg | DELAYED_RELEASE_TABLET | Freq: Two times a day (BID) | ORAL | Status: DC

## 2013-01-11 NOTE — Patient Instructions (Signed)
Tennis Elbow  Your caregiver has diagnosed you with a condition often referred to as "tennis elbow." This results from small tears or soreness (inflammation) at the start (origin) of the extensor muscles of the forearm. Although the condition is often called tennis or golfer's elbow, it is caused by any repetitive action performed by your elbow.  HOME CARE INSTRUCTIONS   If the condition has been short lived, rest may be the only treatment required. Using your opposite hand or arm to perform the task may help. Even changing your grip may help rest the extremity. These may even prevent the condition from recurring.   Longer standing problems, however, will often be relieved faster by:   Using anti-inflammatory agents.   Applying ice packs for 30 minutes at the end of the working day, at bed time, or when activities are finished.   Your caregiver may also have you wear a splint or sling. This will allow the inflamed tendon to heal.  At times, steroid injections aided with a local anesthetic will be required along with splinting for 1 to 2 weeks. Two to three steroid injections will often solve the problem. In some long standing cases, the inflamed tendon does not respond to conservative (non-surgical) therapy. Then surgery may be required to repair it.  MAKE SURE YOU:    Understand these instructions.   Will watch your condition.   Will get help right away if you are not doing well or get worse.  Document Released: 08/12/2005 Document Revised: 11/04/2011 Document Reviewed: 03/30/2008  [employer] Patient Information 2013 [employer].

## 2013-01-11 NOTE — Progress Notes (Signed)
Subjective:    Patient ID: Joe Francis, male    DOB: [birth year], 42 y.o.   MRN: [id number]  HPI 42 year old WM, nonsmoker, is in today with c/o right elbow pain x several weeks without improvement. He has been applying ice that helps. Rates pain 4/10, worse with lifting or doing pushups. He routinely exercises and lifts weights but has been conservative in his weightlifting since his elbow began to trouble him.   Patient also has a history of hypogonadism. He was doing very well while taking testosterone injections biweekly. Since we decreased to once a month, his energy level is decreasing and libido is low. Would like to have testosterone checked.   Review of Systems  Constitutional: Positive for fatigue. Negative for fever and chills.  Respiratory: Negative.   Cardiovascular: Negative.   Gastrointestinal: Negative.   Musculoskeletal: Positive for arthralgias.       Right elbow pain   Skin: Negative.   Neurological: Negative.   Hematological: Negative.   Psychiatric/Behavioral: Negative.    Past Medical History  Diagnosis Date  . Fainting spell     History   Social History  . Marital Status: Married    Spouse Name: N/A    Number of Children: N/A  . 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 Smoking status: Never Smoker   . Smokeless tobacco: Never Used  . Alcohol Use: Yes  . Drug Use: No  . Sexually Active: Not on file   Other Topics Concern  . Not on file   Social History Narrative   5-6 hours of sleep per night   3 people living in the residence    No past surgical history on file.  Family History  Problem Relation Age of Onset  . Miscarriages / [place] Mother   . Cancer Mother   . Depression Father   . Diabetes Father   . Drug abuse Brother   . Hearing loss Brother   . Learning disabilities Brother   . Stroke Brother   . Cancer Maternal Grandfather   . Arthritis Paternal Grandmother      No Known Allergies  Current Outpatient Prescriptions on File Prior to Visit  Medication Sig Dispense Refill  . testosterone cypionate (DEPOTESTOTERONE CYPIONATE) 200 MG/ML injection Inject 0.5 mLs (100 mg total) into the muscle once a week.  10 mL  0   No current facility-administered medications on file prior to visit.    BP 122/80  Pulse 70  Temp(Src) 97.9 F (36.6 C) (Oral)  Wt 280 lb (127.007 kg)  BMI 37.72 kg/m2  SpO2 99%chart     Objective:   Physical Exam  Constitutional: He is oriented to person, place, and time. He appears well-developed and well-nourished.  Neck: Normal range of motion. Neck supple.  Cardiovascular: Normal rate, regular rhythm and normal heart sounds.   Pulmonary/Chest: Effort normal and breath sounds normal.  Musculoskeletal:  Right elbow, tender medially to palpation. No swelling or redness. Moderate pain with forced flexion.  Neurological: He is alert and oriented to person, place, and time.  Skin: Skin is warm and dry.  Psychiatric: He has a normal mood and affect.          Assessment & Plan:  Assessment:  1. Tennis Elbow 2. Hypogonadism 3. Decreased Libido  Plan: Voltaren 75mg  twice a day. Ice to the AA twice a day. Labs sent, testosterone. Will notify patient pending results  and discuss further treatment plan if any.

## 2013-01-11 NOTE — Addendum Note (Signed)
Addended byAdline Mango B on: 01/11/2013 01:43 PM   Modules accepted: Level of Service

## 2013-01-12 ENCOUNTER — Other Ambulatory Visit: Payer: Self-pay

## 2013-01-12 MED ORDER — TESTOSTERONE CYPIONATE 200 MG/ML IM SOLN: INTRAMUSCULAR | Status: DC

## 2013-02-01 ENCOUNTER — Other Ambulatory Visit: Payer: Self-pay

## 2013-02-01 MED ORDER — TESTOSTERONE CYPIONATE 200 MG/ML IM SOLN: INTRAMUSCULAR | Status: DC

## 2013-03-26 ENCOUNTER — Ambulatory Visit (INDEPENDENT_AMBULATORY_CARE_PROVIDER_SITE_OTHER): Payer: BC Managed Care – PPO | Admitting: Family

## 2013-03-26 ENCOUNTER — Encounter: Payer: Self-pay | Admitting: Family

## 2013-03-26 VITALS — BP 132/88 | HR 67 | Wt 272.0 lb

## 2013-03-26 DIAGNOSIS — R5381 Other malaise: Secondary | ICD-10-CM

## 2013-03-26 DIAGNOSIS — R6882 Decreased libido: Secondary | ICD-10-CM

## 2013-03-26 DIAGNOSIS — E291 Testicular hypofunction: Secondary | ICD-10-CM

## 2013-03-26 DIAGNOSIS — R5383 Other fatigue: Secondary | ICD-10-CM

## 2013-03-26 MED ORDER — TESTOSTERONE CYPIONATE 200 MG/ML IM SOLN: INTRAMUSCULAR | Status: DC

## 2013-03-27 LAB — TESTOSTERONE: Testosterone: 97 ng/dL — ABNORMAL LOW (ref 300–890)

## 2013-03-29 NOTE — Progress Notes (Signed)
Subjective:    Patient ID: Joe Francis, male    DOB: July 10, 1971, 42 y.o.   MRN: [id number]  HPI 42 year old white male smoker he is in for recheck of hypergonadism. He's currently taking testosterone injections 200 mg biweekly. He felt a case testosterone level as far mouth. Is beginning to have more fatigue, erectile dysfunction, and decreased libido. He was doing very well then 200 mg once a week but when the dose decreased, so did his testosterone level and energy.   Review of Systems  Constitutional: Positive for fatigue.  Respiratory: Negative.   Cardiovascular: Negative.  Negative for chest pain and palpitations.  Gastrointestinal: Negative.   Endocrine: Negative.   Genitourinary: Negative.        Erectile dysfunction, decreased libido  Musculoskeletal: Negative.   Skin: Negative.   Neurological: Negative.   Hematological: Negative.   Psychiatric/Behavioral: Negative.    Past Medical History  Diagnosis Date  . Fainting spell     History   Social History  . Marital Status: Married    Spouse Name: N/A    Number of Children: N/A  . 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 Smoking status: Never Smoker   . Smokeless tobacco: Never Used  . Alcohol Use: Yes  . Drug Use: No  . Sexually Active: Not on file   Other Topics Concern  . Not on file   Social History Narrative   5-6 hours of sleep per night   3 people living in the residence    History reviewed. No pertinent past surgical history.  Family History  Problem Relation Age of Onset  . Miscarriages / [place] Mother   . Cancer Mother   . Depression Father   . Diabetes Father   . Drug abuse Brother   . Hearing loss Brother   . Learning disabilities Brother   . Stroke Brother   . Cancer Maternal Grandfather   . Arthritis Paternal Grandmother     No Known Allergies  Current Outpatient Prescriptions on File Prior to Visit  Medication  Sig Dispense Refill  . diclofenac (VOLTAREN) 75 MG EC tablet Take 1 tablet (75 mg total) by mouth 2 (two) times daily.  60 tablet  0  . ibuprofen (ADVIL,MOTRIN) 200 MG tablet Take 200 mg by mouth every 6 (six) hours as needed for pain.       No current facility-administered medications on file prior to visit.    BP 132/88  Pulse 67  Wt 272 lb (123.378 kg)  BMI 36.64 kg/m2  SpO2 98%chart    Objective:   Physical Exam  Constitutional: He appears well-developed and well-nourished.  HENT:  Right Ear: External ear normal.  Left Ear: External ear normal.  Nose: Nose normal.  Mouth/Throat: Oropharynx is clear and moist.  Neck: Normal range of motion. Neck supple.  Cardiovascular: Normal rate, regular rhythm and normal heart sounds.   Pulmonary/Chest: Effort normal and breath sounds normal.  Abdominal: Soft. Bowel sounds are normal.  Musculoskeletal: Normal range of motion.  Neurological: He is alert.  Skin: Skin is warm and dry.  Psychiatric: He has a normal mood and affect.          Assessment & Plan:  Assessment: 1. Hypergonadism 2. Fatigue 3. Erectile dysfunction  Plan: Temporarily increase testosterone level to 200 mg once weekly x6 weeks. Then biweekly. Testosterone level sent today. We'll follow the patient for further instructions pending labs  now and in 6 weeks.

## 2013-04-27 ENCOUNTER — Telehealth: Payer: Self-pay | Admitting: Family

## 2013-04-27 NOTE — Telephone Encounter (Signed)
Pt states he went to pharm to pu testosterone and pharm states insurance com needs more infor in order for them to pay. pls advise.  Pt has already pu  4 doses of the original 6 on script.

## 2013-04-27 NOTE — Telephone Encounter (Signed)
Prior auth submitted online.

## 2013-04-27 NOTE — Telephone Encounter (Signed)
Patient has prior auth for this already on file, good until 04/03/2015. There is a possibility that though he is still with [employer] [place], he's had a policy change. Spoke with patient, and he will fax me his new card; I will investigate from there.

## 2013-04-28 NOTE — Telephone Encounter (Signed)
Prior auth approved. Called patient on 9/2 & [employer] w/ approval information.

## 2013-05-04 ENCOUNTER — Other Ambulatory Visit (INDEPENDENT_AMBULATORY_CARE_PROVIDER_SITE_OTHER): Payer: BC Managed Care – PPO

## 2013-05-04 DIAGNOSIS — E291 Testicular hypofunction: Secondary | ICD-10-CM

## 2013-05-04 LAB — TESTOSTERONE: Testosterone: 1000.36 ng/dL — ABNORMAL HIGH (ref 350.00–890.00)

## 2013-05-06 ENCOUNTER — Telehealth: Payer: Self-pay

## 2013-05-06 MED ORDER — TESTOSTERONE CYPIONATE 200 MG/ML IM SOLN: INTRAMUSCULAR | Status: DC

## 2013-05-06 NOTE — Telephone Encounter (Signed)
Message copied by Beverely Low on Thu May 06, 2013  8:33 AM ------      Message from: Adline Mango B      Created: Wed May 05, 2013  8:26 AM       Change dose to q3 weeks instead of biweekly. ------

## 2013-07-05 ENCOUNTER — Ambulatory Visit (INDEPENDENT_AMBULATORY_CARE_PROVIDER_SITE_OTHER): Payer: BC Managed Care – PPO | Admitting: Family

## 2013-07-05 ENCOUNTER — Encounter: Payer: Self-pay | Admitting: Family

## 2013-07-05 VITALS — BP 120/76 | HR 66 | Wt 265.0 lb

## 2013-07-05 DIAGNOSIS — E291 Testicular hypofunction: Secondary | ICD-10-CM

## 2013-07-05 DIAGNOSIS — R5381 Other malaise: Secondary | ICD-10-CM

## 2013-07-05 LAB — BASIC METABOLIC PANEL
Calcium: 9.1 mg/dL (ref 8.4–10.5)
Creatinine, Ser: 1.1 mg/dL (ref 0.4–1.5)
GFR: 81.44 mL/min (ref >60.00)
Sodium: 138 mEq/L (ref 135–145)

## 2013-07-05 LAB — TESTOSTERONE: Testosterone: 69.2 ng/dL — ABNORMAL LOW (ref 350.00–890.00)

## 2013-07-05 NOTE — Progress Notes (Signed)
  Subjective:    Patient ID: Joe Francis, male    DOB: 12-17-1970, 42 y.o.   MRN: [id number]  HPI  42 year old white male, nonsmoker is in today with concerns that his testosterone level has fallen despite taken 200 mg biweekly testosterone. He feels more fatigued, achy, decreased exercise since she's been on this dose. In the past, we have tried biweekly 200 mg, 100mg  weekly, that have all been ineffective in maintenance.   Review of Systems  Constitutional: Positive for fatigue.  HENT: Negative.   Respiratory: Negative.   Cardiovascular: Negative.   Genitourinary: Negative.        Decreased sex drive  Musculoskeletal: Negative.   Skin: Negative.   Psychiatric/Behavioral: Negative.    Past Medical History  Diagnosis Date  . Fainting spell     History   Social History  . Marital Status: Married    Spouse Name: N/A    Number of Children: N/A  . 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 Smoking status: Never Smoker   . Smokeless tobacco: Never Used  . Alcohol Use: Yes  . Drug Use: No  . Sexual Activity: Not on file   Other Topics Concern  . Not on file   Social History Narrative   5-6 hours of sleep per night   3 people living in the residence    History reviewed. No pertinent past surgical history.  Family History  Problem Relation Age of Onset  . Miscarriages / [place] Mother   . Cancer Mother   . Depression Father   . Diabetes Father   . Drug abuse Brother   . Hearing loss Brother   . Learning disabilities Brother   . Stroke Brother   . Cancer Maternal Grandfather   . Arthritis Paternal Grandmother     No Known Allergies  Current Outpatient Prescriptions on File Prior to Visit  Medication Sig Dispense Refill  . diclofenac (VOLTAREN) 75 MG EC tablet Take 1 tablet (75 mg total) by mouth 2 (two) times daily.  60 tablet  0  . ibuprofen (ADVIL,MOTRIN) 200 MG tablet Take 200 mg by mouth  every 6 (six) hours as needed for pain.      Marland Kitchen testosterone cypionate (DEPOTESTOTERONE CYPIONATE) 200 MG/ML injection Inject 200mg  every 3 weeks.  10 mL  0   No current facility-administered medications on file prior to visit.    BP 120/76  Pulse 66  Wt 265 lb (120.203 kg)chart    Objective:   Physical Exam  Constitutional: He is oriented to person, place, and time. He appears well-developed and well-nourished.  Neck: Normal range of motion. Neck supple. No thyromegaly present.  Cardiovascular: Normal rate, regular rhythm and normal heart sounds.   Pulmonary/Chest: Effort normal and breath sounds normal.  Musculoskeletal: Normal range of motion.  Neurological: He is alert and oriented to person, place, and time.  Skin: Skin is warm and dry.  Psychiatric: He has a normal mood and affect.          Assessment & Plan:  Assessment:  1. Fatigue 2. Hypogonadism  Plan: Testosterone and BMP sent. Will notify patient pending results and discuss further treatment plan.

## 2013-07-05 NOTE — Patient Instructions (Signed)
Continue current med 

## 2013-07-05 NOTE — Progress Notes (Signed)
Pre-visit discussion using our clinic review tool. No additional management support is needed unless otherwise documented below in the visit note.  

## 2013-07-07 ENCOUNTER — Other Ambulatory Visit: Payer: Self-pay | Admitting: Family

## 2013-07-07 DIAGNOSIS — E291 Testicular hypofunction: Secondary | ICD-10-CM

## 2013-07-13 ENCOUNTER — Encounter: Payer: Self-pay | Admitting: Family

## 2013-08-04 ENCOUNTER — Telehealth: Payer: Self-pay | Admitting: Family
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
## 2013-09-14 ENCOUNTER — Encounter: Payer: Self-pay | Admitting: Family

## 2013-09-14 ENCOUNTER — Ambulatory Visit (INDEPENDENT_AMBULATORY_CARE_PROVIDER_SITE_OTHER): Payer: BC Managed Care – PPO | Admitting: Family

## 2013-09-14 VITALS — BP 110/80 | HR 74 | Wt 260.0 lb

## 2013-09-14 DIAGNOSIS — M25529 Pain in unspecified elbow: Secondary | ICD-10-CM

## 2013-09-14 DIAGNOSIS — M7020 Olecranon bursitis, unspecified elbow: Secondary | ICD-10-CM

## 2013-09-14 DIAGNOSIS — M7030 Other bursitis of elbow, unspecified elbow: Secondary | ICD-10-CM

## 2013-09-14 DIAGNOSIS — R55 Syncope and collapse: Secondary | ICD-10-CM

## 2013-09-14 MED ORDER — METHYLPREDNISOLONE 4 MG PO KIT: PACK | ORAL | Status: AC
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
## 2013-09-14 NOTE — Patient Instructions (Signed)
Bursitis Bursitis is a swelling and soreness (inflammation) of a fluid-filled sac (bursa) that overlies and protects a joint. It can be caused by injury, overuse of the joint, arthritis or infection. The joints most likely to be affected are the elbows, shoulders, hips and knees. HOME CARE INSTRUCTIONS   Apply ice to the affected area for 15-20 minutes each hour while awake for 2 days. Put the ice in a plastic bag and place a towel between the bag of ice and your skin.  Rest the injured joint as much as possible, but continue to put the joint through a full range of motion, 4 times per day. (The shoulder joint especially becomes rapidly "frozen" if not used.) When the pain lessens, begin normal slow movements and usual activities.  Only take over-the-counter or prescription medicines for pain, discomfort or fever as directed by your caregiver.  Your caregiver may recommend draining the bursa and injecting medicine into the bursa. This may help the healing process.  Follow all instructions for follow-up with your caregiver. This includes any orthopedic referrals, physical therapy and rehabilitation. Any delay in obtaining necessary care could result in a delay or failure of the bursitis to heal and chronic pain. SEEK IMMEDIATE MEDICAL CARE IF:   Your pain increases even during treatment.  You develop an oral temperature above 102 F (38.9 C) and have heat and inflammation over the involved bursa. MAKE SURE YOU:   Understand these instructions.  Will watch your condition.  Will get help right away if you are not doing well or get worse. Document Released: 08/09/2000 Document Revised: 11/04/2011 Document Reviewed: 07/14/2009 Exit[employer] Patient Information 2014 [employer].  

## 2013-09-14 NOTE — Progress Notes (Signed)
Pre visit review using our clinic review tool, if applicable. No additional management support is needed unless otherwise documented below in the visit note. 

## 2013-11-09 ENCOUNTER — Telehealth: Payer: Self-pay | Admitting: Family

## 2013-11-09 DIAGNOSIS — R400 Somnolence: Secondary | ICD-10-CM

## 2013-11-09 NOTE — Telephone Encounter (Signed)
Pt is wanting to speak with padonda regarding the letter she wrote to the state for him. Pt states they are saying it needs to be more specific.

## 2013-11-10 NOTE — Telephone Encounter (Signed)
If he has not had a sleep study, needs one. I do not see a documented study.

## 2013-11-10 NOTE — Telephone Encounter (Signed)
Pt has not had a sleep study.   Referral ordered for pulmonology
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
## 2013-11-23 ENCOUNTER — Ambulatory Visit (INDEPENDENT_AMBULATORY_CARE_PROVIDER_SITE_OTHER)
Admission: RE | Admit: 2013-11-23 | Discharge: 2013-11-23 | Disposition: A | Discharge status: Home / Self Care | Payer: BC Managed Care – PPO | Source: Ambulatory Visit | Attending: Family | Admitting: Family

## 2013-11-23 ENCOUNTER — Ambulatory Visit (INDEPENDENT_AMBULATORY_CARE_PROVIDER_SITE_OTHER): Payer: BC Managed Care – PPO | Admitting: Internal Medicine

## 2013-11-23 ENCOUNTER — Encounter: Payer: Self-pay | Admitting: Family

## 2013-11-23 ENCOUNTER — Ambulatory Visit (INDEPENDENT_AMBULATORY_CARE_PROVIDER_SITE_OTHER): Payer: BC Managed Care – PPO | Admitting: Family

## 2013-11-23 ENCOUNTER — Encounter: Payer: Self-pay | Admitting: Internal Medicine

## 2013-11-23 ENCOUNTER — Other Ambulatory Visit: Payer: Self-pay | Admitting: Family

## 2013-11-23 ENCOUNTER — Telehealth: Payer: Self-pay

## 2013-11-23 VITALS — BP 126/86 | HR 60 | Ht 73.5 in | Wt 260.4 lb

## 2013-11-23 VITALS — BP 100/60 | HR 77 | Wt 258.0 lb

## 2013-11-23 DIAGNOSIS — E291 Testicular hypofunction: Secondary | ICD-10-CM

## 2013-11-23 DIAGNOSIS — M25521 Pain in right elbow: Secondary | ICD-10-CM

## 2013-11-23 DIAGNOSIS — R55 Syncope and collapse: Secondary | ICD-10-CM

## 2013-11-23 DIAGNOSIS — M25529 Pain in unspecified elbow: Secondary | ICD-10-CM

## 2013-11-23 DIAGNOSIS — G4733 Obstructive sleep apnea (adult) (pediatric): Secondary | ICD-10-CM

## 2013-11-23 DIAGNOSIS — F3289 Other specified depressive episodes: Secondary | ICD-10-CM

## 2013-11-23 DIAGNOSIS — R040 Epistaxis: Secondary | ICD-10-CM

## 2013-11-23 DIAGNOSIS — R6882 Decreased libido: Secondary | ICD-10-CM

## 2013-11-23 DIAGNOSIS — F329 Major depressive disorder, single episode, unspecified: Secondary | ICD-10-CM

## 2013-11-23 MED ORDER — BUPROPION HCL ER (XL) 150 MG PO TB24: 150.0000 mg | ORAL_TABLET | Freq: Every day | ORAL | Status: DC

## 2013-11-23 NOTE — Progress Notes (Signed)
Pre visit review using our clinic review tool, if applicable. No additional management support is needed unless otherwise documented below in the visit note. 

## 2013-11-23 NOTE — Progress Notes (Signed)
Subjective:    Patient ID: Joe Francis, male    DOB: [birth year], 43 y.o.   MRN: [id number]  HPI 43 year old white male, nonsmoker is in today with complaints of right elbow pain. He was seen in January for similar symptoms. Was treated with a Medrol pack that wasn't very effective. He continues to have pain that is constantly at 2 minutes and is worse with lifting and at night increasing to about a 6/10. Has not taken any medication for relief.   During his visit today patient is tearful and reports feeling down and depressed. He has a history of hypogonadism that is being managed by urology in the past who has taken him off of hormone replacement therapy. Patient was doing well on therapy having difficulty stabilizing testosterone level. He is now on Clomid. Patient has become very frustrated in his urologist had suggested he see an endocrinologist. Reports fatigue, depression, decreased sex drive. All of these things have led to some marital stressors in his wife is considering separation. Reports feelings of helplessness and hopelessness but denies any thoughts of death or dying. No intent to commit suicide.   Review of Systems  Constitutional: Negative.   HENT: Negative.   Respiratory: Negative.   Cardiovascular: Negative.   Gastrointestinal: Negative.   Endocrine:       Decreased sex drive.   Genitourinary: Negative.   Musculoskeletal: Positive for arthralgias.       Right elbow pain  Skin: Negative.   Neurological: Negative.   Psychiatric/Behavioral: The patient is nervous/anxious.    Past Medical History  Diagnosis Date  . Fainting spell     History   Social History  . Marital Status: Married    Spouse Name: N/A    Number of Children: N/A  . 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 Smoking status: Never Smoker   . Smokeless tobacco: Never Used  . Alcohol Use: Yes  . Drug Use: No  . Sexual Activity:  Not on file   Other Topics Concern  . Not on file   Social History Narrative   5-6 hours of sleep per night   3 people living in the residence    History reviewed. No pertinent past surgical history.  Family History  Problem Relation Age of Onset  . Miscarriages / [place] Mother   . Cancer Mother   . Depression Father   . Diabetes Father   . Drug abuse Brother   . Hearing loss Brother   . Learning disabilities Brother   . Stroke Brother   . Cancer Maternal Grandfather   . Arthritis Paternal Grandmother     No Known Allergies  Current Outpatient Prescriptions on File Prior to Visit  Medication Sig Dispense Refill  . ibuprofen (ADVIL,MOTRIN) 200 MG tablet Take 200 mg by mouth every 6 (six) hours as needed for pain.       No current facility-administered medications on file prior to visit.    BP 100/60  Pulse 77  Wt 258 lb (117.028 kg)chart    Objective:   Physical Exam  Constitutional: He is oriented to person, place, and time. He appears well-developed and well-nourished.  Neck: Normal range of motion. Neck supple. No thyromegaly present.  Cardiovascular: Normal rate, regular rhythm and normal heart sounds.   Pulmonary/Chest: Effort normal and breath sounds normal.  Abdominal: Soft. Bowel sounds are normal.  Musculoskeletal: Normal range of motion.  Neurological: He is alert and oriented to person, place, and time.  Skin: Skin is warm and dry.  Psychiatric: He has a normal mood and affect.          Assessment & Plan:  Liev was seen today for elbow pain.  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   Call the office  with any questions or concerns. Follow-up pending xray.
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
## 2013-11-23 NOTE — Telephone Encounter (Signed)
Ok to write letter
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
## 2013-11-23 NOTE — Patient Instructions (Signed)
Order- split protocol NPSG    Dx OSA  Try a nasal saline gel for your nosebleeds

## 2013-11-23 NOTE — Patient Instructions (Signed)

## 2013-11-24 ENCOUNTER — Encounter: Payer: Self-pay | Admitting: Endocrinology

## 2013-11-24 ENCOUNTER — Ambulatory Visit (INDEPENDENT_AMBULATORY_CARE_PROVIDER_SITE_OTHER): Payer: BC Managed Care – PPO | Admitting: Endocrinology

## 2013-11-24 VITALS — BP 118/78 | HR 67 | Temp 98.4°F | Resp 16 | Ht 75.0 in | Wt 257.8 lb

## 2013-11-24 DIAGNOSIS — F32A Depression, unspecified: Secondary | ICD-10-CM

## 2013-11-24 DIAGNOSIS — E291 Testicular hypofunction: Secondary | ICD-10-CM

## 2013-11-24 DIAGNOSIS — F329 Major depressive disorder, single episode, unspecified: Secondary | ICD-10-CM

## 2013-11-24 DIAGNOSIS — E785 Hyperlipidemia, unspecified: Secondary | ICD-10-CM

## 2013-11-24 DIAGNOSIS — F3289 Other specified depressive episodes: Secondary | ICD-10-CM

## 2013-11-24 MED ORDER — TESTOSTERONE 30 MG/ACT TD SOLN: 120.0000 mg | Freq: Every day | TRANSDERMAL | Status: DC

## 2013-11-24 NOTE — Progress Notes (Signed)
Patient ID: Joe Francis, male   DOB: 12-02-1970, 43 y.o.   MRN: [id number]           Chief complaint: Low testosterone  History of Present Illness:  Hypogonadismwas initially diagnosed in 2013  He had at diagnosis  complaints of fatigue, lack of energy, decreased libido, decreased motivation. He had a low testosterone level in 2013 and was not evaluated with further labs He was initially tried on AndroGel but because of lack of increase in testosterone level he was started on testosterone injections.  He thinks that overall his symptoms improved with the injections His injection regimen was adjusted based on his testosterone level and he was taking regimens ranging from 200 mg every 2 weeks to 100 mg weekly. However with even a weekly injection he would have improved libido and energy only for 4 or 5 days With increasing his dosage his level went up to 892 and his dose was reduced somewhat. However with the injections his testosterone levels have fluctuated between 97 up to 1000  Since December he stopped taking the testosterone injections and was referred to the urologist. His testosterone level was 202/15 and he was evaluated further with LH, FSH, prolactin and thyroid functions which were normal. He was tried on clomiphene, initially 25 mg daily which did not help him subjectively even though his testosterone level had improved to 251 along with a free testosterone level of 69 compared to 57 at baseline  He still does not have much energy and also is complaining of significant depressed mood number lack of motivation, very little libido Overall he also has had steady weight gain over the last couple of years. He did start losing weight in 12/14 because of decreased appetite from his depression He tries to do some exercise with walking    There is no history of the following: Hot flushes, sweats, breast enlargement, long term steroid use, history of testicular injury mumps in  childhood. No history of osteopenia or low impact fracture  Prior lab results show baselinetestosterone level of 199  Component     Latest Ref Rng 05/22/2012 07/06/2012 08/28/2012 10/12/2012  Testosterone     350.00 - 890.00 ng/dL [phone] (L) [id number] (L) [phone] 891.87 (H)   Component     Latest Ref Rng 01/11/2013 03/26/2013 05/04/2013 07/05/2013  Testosterone     350.00 - 890.00 ng/dL [phone] (L) 97 (L) [phone] (H) 69.20 (L)   Prolactin level: 6.4; LH level: 8.2 (normal <9.3)       Medication List       This list is accurate as of: 11/24/13 11:59 PM.  Always use your most recent med list.               buPROPion 150 MG 24 hr tablet  Commonly known as:  WELLBUTRIN XL  Take 1 tablet (150 mg total) by mouth daily.     clomiPHENE 50 MG tablet  Commonly known as:  CLOMID  Take 50 mg by mouth daily.     ibuprofen 200 MG tablet  Commonly known as:  ADVIL,MOTRIN  Take 200 mg by mouth every 6 (six) hours as needed for pain.     Testosterone 30 MG/ACT Soln  Commonly known as:  AXIRON  Place 120 mg onto the skin daily.        Allergies: No Known Allergies  Past Medical History  Diagnosis Date  . Fainting spell     No past surgical history on file.  Family History  Problem Relation Age of Onset  . Miscarriages / [place] Mother   . Cancer Mother   . Depression Father   . Diabetes Father   . Drug abuse Brother   . Hearing loss Brother   . Learning disabilities Brother   . Stroke Brother   . Cancer Maternal Grandfather   . Arthritis Paternal Grandmother   . Hypertension Neg Hx     Social History:  reports that he has never smoked. He has never used smokeless tobacco. He reports that he drinks alcohol. He reports that he does not use illicit drugs.  ROS  No history of unusual headaches No history of blurred vision including peripheral vision No history of hypertension No history of abnormal fasting glucose or diabetes He has just been started on  Wellbutrin for depression   General Examination:   BP 118/78  Pulse 67  Temp(Src) 98.4 F (36.9 C)  Resp 16  Ht 6\' 3"  (1.905 m)  Wt 257 lb 12.8 oz (116.937 kg)  BMI 32.22 kg/m2  SpO2 96%  GENERAL APPEARANCE mild generalized obesity present, no cushingoid features.  SKIN:normal, no rash or pigmentation.  HEENT:Oral mucosa and tongue normal EYES:normal external appearance of eyes, Fundii show normal discs and vessels  NECK:no lymphadenopathy, no thyromegaly.  CHEST: Gynecomastia absent LUNGS:clear to auscultation bilaterally, no wheezes, rhonchi, rales.   HEART:normal S1 And S2, no S3, S4, murmur or click.  ABDOMEN:no hepatosplenomegaly, no masses palpated, soft and not tender.   MALE GENITOURINARY: Both testicles are about 3.5 x 24 cm, mild thickening of the epididymis on the left side. Phallus normal  MUSCULOSKELETALNo enlargement or deformity of joints.  EXTREMITIES:no clubbing, no edema.  NEUROLOGIC EXAM: Biceps reflexes normal (2+) bilaterally.   Assessment/ Plan: 1. 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 He appears symptomatic from this although his depression is relatively new symptom and out of proportion to his levels of free testosterone Although he has had some improvement in his levels from Clomid this is not usually a long-term treatment He also has had difficulty getting consistent levels with testosterone injections Have recommended he start Axiron 120 mg daily in the form of 2 bumps on each arm daily. Discussed how this works and Careers information officer and have levels checked in another 4 weeks in followup. Discussed target levels of about mid normal with both free and total testosterone and not to get levels on the high side.  2. Depression: Although hypogonadism may be playing a role this may need to be treated separately  2. Mild dyslipidemia with low HDL and  high triglycerides. Also has some history of diabetes. May have metabolic syndrome.   Yehudit Fulginiti 11/25/2013, 3:22 PM

## 2013-11-24 NOTE — Telephone Encounter (Signed)
[facility]

## 2013-11-24 NOTE — Patient Instructions (Signed)
Axiron 2 pumps under each arm daily

## 2013-11-25 NOTE — Addendum Note (Signed)
Addended by: Reather LittlerKUMAR, Leonette Tischer on: 11/25/2013 03:25 PM   Modules accepted: Orders

## 2013-11-29 ENCOUNTER — Other Ambulatory Visit: Payer: Self-pay | Admitting: *Deleted

## 2013-11-29 MED ORDER — TESTOSTERONE 20.25 MG/1.25GM (1.62%) TD GEL: TRANSDERMAL | Status: DC

## 2013-12-15 ENCOUNTER — Other Ambulatory Visit (INDEPENDENT_AMBULATORY_CARE_PROVIDER_SITE_OTHER): Payer: BC Managed Care – PPO

## 2013-12-15 DIAGNOSIS — E785 Hyperlipidemia, unspecified: Secondary | ICD-10-CM

## 2013-12-15 DIAGNOSIS — E291 Testicular hypofunction: Secondary | ICD-10-CM

## 2013-12-15 LAB — GLUCOSE, RANDOM: Glucose, Bld: 98 mg/dL (ref 70–99)

## 2013-12-18 LAB — TESTOSTERONE, FREE, TOTAL, SHBG
Testosterone, Free: 10.8 pg/mL (ref 6.8–21.5)
Testosterone, total: 398.4 ng/dL (ref 348.0–1197.0)

## 2013-12-19 DIAGNOSIS — G4733 Obstructive sleep apnea (adult) (pediatric): Secondary | ICD-10-CM | POA: Insufficient documentation

## 2013-12-19 DIAGNOSIS — R55 Syncope and collapse: Secondary | ICD-10-CM

## 2013-12-19 HISTORY — DX: Syncope and collapse: R55

## 2013-12-19 NOTE — Assessment & Plan Note (Signed)
Plan-discussed sleep hygiene. Schedule polysomnogram

## 2013-12-19 NOTE — Assessment & Plan Note (Signed)
Sounds like dry nose Plan-saline nasal spray or gel

## 2013-12-20 ENCOUNTER — Encounter: Payer: Self-pay | Admitting: Endocrinology

## 2013-12-20 ENCOUNTER — Ambulatory Visit (INDEPENDENT_AMBULATORY_CARE_PROVIDER_SITE_OTHER): Payer: BC Managed Care – PPO | Admitting: Endocrinology

## 2013-12-20 VITALS — BP 110/72 | HR 70 | Temp 97.9°F | Resp 14 | Ht 75.0 in | Wt 252.8 lb

## 2013-12-20 DIAGNOSIS — E291 Testicular hypofunction: Secondary | ICD-10-CM

## 2013-12-20 NOTE — Patient Instructions (Signed)
Continue 2 pumps on each side in ams
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
## 2013-12-21 ENCOUNTER — Ambulatory Visit (HOSPITAL_BASED_OUTPATIENT_CLINIC_OR_DEPARTMENT_OTHER): Payer: BC Managed Care – PPO | Attending: Internal Medicine | Admitting: Radiology

## 2013-12-21 VITALS — Ht 75.0 in | Wt 248.0 lb

## 2013-12-21 DIAGNOSIS — G4733 Obstructive sleep apnea (adult) (pediatric): Secondary | ICD-10-CM

## 2013-12-25 DIAGNOSIS — G4710 Hypersomnia, unspecified: Secondary | ICD-10-CM

## 2013-12-25 DIAGNOSIS — G4730 Sleep apnea, unspecified: Secondary | ICD-10-CM

## 2013-12-25 NOTE — Sleep Study (Signed)
   NAME: Joe Francis DATE OF BIRTH:  02-17-1971 MEDICAL RECORD NUMBER [id number]  LOCATION: [facility]  PHYSICIAN: Clinton D Young  DATE OF STUDY: 12/21/2013  SLEEP STUDY TYPE: Nocturnal Polysomnogram               REFERRING PHYSICIAN: Jetty DuhamelYoung, Clinton D, MD  INDICATION FOR STUDY:  hypersomnia with sleep apnea  EPWORTH SLEEPINESS SCORE:   7/24 HEIGHT: 6\' 3"  (190.5 cm)  WEIGHT: 248 lb (112.492 kg)    Body mass index is 31 kg/(m^2).  NECK SIZE: 16.5 in.  MEDICATIONS: Charted for review  SLEEP ARCHITECTURE: Total sleep time 381.5 minutes with sleep efficiency 88.1%, stage I was 7.2%, stage II 64.4%, stage III absent, REM 28.4% of total sleep time. Sleep latency 31 minutes, REM latency 15 minutes, awake after sleep onset 20.5 minutes, arousal index 13.2, bedtime medication: None  RESPIRATORY DATA: Apnea hypopnea index (AHI) 10.2 per hour. 65 total events scored including 16 obstructive apneas and 49 hypopneas. Most events were while supine. REM AHI 16.6 per hour. There were not enough early events to the protocol requirements for split CPAP titration.  OXYGEN DATA: Moderately loud snoring with oxygen desaturation to a nadir of 81% and mean oxygen saturation through the study of 93.3% on room air.  CARDIAC DATA: Normal sinus rhythm  MOVEMENT/PARASOMNIA: 39 limb jerks were counted of which only 2 were associated with arousals or awakenings for periodic limb movement with arousal index of 0.3 per hour. No bathroom trips. 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  IMPRESSION/ RECOMMENDATION:   1) Mild obstructive sleep apnea/hypopnea syndrome, AHI 10.2 per hour with mainly supine events. REM AHI 16.6 per hour. Moderately loud snoring with oxygen desaturation to a nadir of 81% and mean oxygen saturation through the study of 93.3% on room air.  2) There were not enough events to permit application of split protocol CPAP titration on this study. This patient can return for a  dedicated CPAP titration study if appropriate. 3) Bruxism was noted  Signed Jetty Duhamellinton Young M.D. Waymon Budgelinton D Young Diplomate, Biomedical engineerAmerican Board of Sleep Medicine  ELECTRONICALLY SIGNED ON:  12/25/2013, 10:46 AM [facility] PH: [phone]   FX: [phone] ACCREDITED BY THE AMERICAN ACADEMY OF SLEEP MEDICINE

## 2013-12-30 ENCOUNTER — Encounter (HOSPITAL_BASED_OUTPATIENT_CLINIC_OR_DEPARTMENT_OTHER): Payer: BC Managed Care – PPO

## 2014-01-03 ENCOUNTER — Ambulatory Visit (INDEPENDENT_AMBULATORY_CARE_PROVIDER_SITE_OTHER): Payer: BC Managed Care – PPO | Admitting: Internal Medicine

## 2014-01-03 ENCOUNTER — Encounter: Payer: Self-pay | Admitting: Internal Medicine

## 2014-01-03 VITALS — BP 118/66 | HR 67 | Ht 73.5 in | Wt 255.4 lb

## 2014-01-03 DIAGNOSIS — G4733 Obstructive sleep apnea (adult) (pediatric): Secondary | ICD-10-CM

## 2014-01-03 DIAGNOSIS — R55 Syncope and collapse: Secondary | ICD-10-CM

## 2014-01-03 NOTE — Assessment & Plan Note (Signed)
Mild sleep apnea apparent mostly when the technician asked him to sleep on his back. He mostly sleeps on his side at home. With weight loss and side-sleeping, he is likely to have minimal apnea events and probably other intervention not necessary. I doubt that this condition would make him sleepy enough to affect his driving- no basis for restriction based on sleep.  He could consider CPAP or a mouthpiece in the future if needed. 

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
## 2014-01-03 NOTE — Patient Instructions (Signed)
If you are able to sleep comfortably on your sides and able to lose a little more weight, and if snoring isn't much of a problem, then we don't necessarily need to treat your mild sleep apnea.  If you aren't doing as well as you would like, I would be happy to see you again to discuss more specific treatment for sleep apnea.  I don't think your sleep apnea is likely to affect your driving. Like any driver, it is your responsibility not to drive if you don't feel you can do so safely.

## 2014-01-06 ENCOUNTER — Telehealth: Payer: Self-pay | Admitting: Family

## 2014-01-06 NOTE — Telephone Encounter (Signed)
Pt would like a call back about [facility]    [phone]

## 2014-01-07 ENCOUNTER — Ambulatory Visit: Payer: BC Managed Care – PPO | Admitting: Internal Medicine

## 2014-01-07 NOTE — Telephone Encounter (Signed)
Spoke with pt and he would like to know if Oran Reinadonda has reviewed the results of his sleep study. He states that DMV is giving him until 01/16/14 to have this taken care of

## 2014-01-24 ENCOUNTER — Telehealth: Payer: Self-pay

## 2014-01-24 NOTE — Telephone Encounter (Signed)
Following up on letter for [facility]

## 2014-01-25 NOTE — Telephone Encounter (Signed)
Pt requesting a new letter for [facility] stating that he is ok to drive. Pt has completed the sleep study and would like for it to be reviewed by PCP. Advised pt I will forward message to [facility] for review

## 2014-01-25 NOTE — Telephone Encounter (Signed)
Pt aware and requests that letter be faxed to [facility] at [phone].  Letter faxed

## 2014-01-25 NOTE — Telephone Encounter (Signed)
Sent to [employer]. Advise patient.

## 2014-01-31 ENCOUNTER — Other Ambulatory Visit: Payer: BC Managed Care – PPO

## 2014-01-31 DIAGNOSIS — E291 Testicular hypofunction: Secondary | ICD-10-CM

## 2014-02-02 LAB — TESTOSTERONE, TOTAL, LC/MS: Testosterone, total: 1062.6 ng/dL (ref 348.0–1197.0)

## 2014-02-03 ENCOUNTER — Ambulatory Visit (INDEPENDENT_AMBULATORY_CARE_PROVIDER_SITE_OTHER): Payer: BC Managed Care – PPO | Admitting: Endocrinology

## 2014-02-03 ENCOUNTER — Encounter: Payer: Self-pay | Admitting: Endocrinology

## 2014-02-03 VITALS — BP 116/70 | HR 78 | Temp 98.0°F | Resp 14 | Ht 75.0 in | Wt 253.8 lb

## 2014-02-03 DIAGNOSIS — E291 Testicular hypofunction: Secondary | ICD-10-CM

## 2014-02-03 NOTE — Progress Notes (Signed)
Patient ID: Joe Francis, male   DOB: 30-Oct-1970, 43 y.o.   MRN: [id number]           Chief complaint: Low testosterone  History of Present Illness:  Hypogonadismwas initially diagnosed in 2013  He had at diagnosis  complaints of fatigue, lack of energy, decreased libido, decreased motivation. He had a low testosterone level in 2013 and was not evaluated with further labs He was initially tried on AndroGel but because of lack of increase in testosterone level he was started on testosterone injections.  He thinks that overall his symptoms improved with the injections His injection regimen was adjusted based on his testosterone level and he was taking regimens ranging from 200 mg every 2 weeks to 100 mg weekly. However with even a weekly injection he would have improved libido and energy only for 4 or 5 days With increasing his dosage his level went up to 892 and his dose was reduced somewhat. However with the injections his testosterone levels have fluctuated between 97 up to 1000 Since December he was off injections. His testosterone level with urologist was 202 and he was evaluated further with LH, FSH, prolactin and thyroid functions which were normal. He was tried on clomiphene, initially 25 mg daily which did not help him subjectively even though his testosterone level had improved to 251 along with a free testosterone level of 69 compared to 57 at baseline  Recent history: Because of inadequate improvement in testosterone level with Clomid and the need for long-term supplementation he was switched to AndroGel in 4/15. He has used 2 pumps on each arm and although his level was improved on the short-term followup visit he had not started feeling better However in the last few weeks he has started feeling much more energy as well as improved libido He has been applying the gel consistently in the morning after his shower and had no difficulty with the gel drying. He is applying it  directly on the shoulder Overall his mood and libido are significantly better also Was also been on Wellbutrin which has helped his insomnia  Baselinetestosterone level was 199 but has been as low as 69 at the lowest point The total testosterone now is 1096 compared to previous level of 398       Medication List       This list is accurate as of: 02/03/14  8:01 AM.  Always use your most recent med list.               buPROPion 150 MG 24 hr tablet  Commonly known as:  WELLBUTRIN XL  Take 1 tablet (150 mg total) by mouth daily.     ibuprofen 200 MG tablet  Commonly known as:  ADVIL,MOTRIN  Take 200 mg by mouth every 6 (six) hours as needed for pain.     Testosterone 20.25 MG/1.25GM (1.62%) Gel  Commonly known as:  ANDROGEL  Use 2 pumps on each arm daily        Allergies: No Known Allergies  Past Medical History  Diagnosis Date  . Fainting spell     No past surgical history on file.  Family History  Problem Relation Age of Onset  . Miscarriages / [place]   . Cancer Mother   . Depression Father   . Diabetes Father   . Drug abuse Brother   . Hearing loss Brother   . Learning disabilities Brother   . Stroke Brother   . Cancer Maternal Grandfather   .  Arthritis Paternal Grandmother   . Hypertension Neg Hx     Social History:  reports that he has never smoked. He has never used smokeless tobacco. He reports that he drinks alcohol. He reports that he does not use illicit drugs.  ROS  No history of abnormal fasting glucose or diabetes, recent glucose below 100 He has just been started on Wellbutrin for depression  Sleep better now  Exercise: Running 3 times a week. He has no difficulty now  General Examination:   There were no vitals taken for this visit.   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

## 2014-02-03 NOTE — Patient Instructions (Addendum)
2 pumps a day total

## 2014-02-11 ENCOUNTER — Other Ambulatory Visit: Payer: Self-pay | Admitting: *Deleted

## 2014-02-11 ENCOUNTER — Telehealth: Payer: Self-pay | Admitting: Endocrinology

## 2014-02-11 MED ORDER — TESTOSTERONE 20.25 MG/1.25GM (1.62%) TD GEL: TRANSDERMAL | Status: DC

## 2014-02-11 NOTE — Telephone Encounter (Signed)
rx sent

## 2014-02-11 NOTE — Telephone Encounter (Signed)
Androgel rx  Pharmacy [facility]   Thank You :)

## 2014-03-10 ENCOUNTER — Other Ambulatory Visit: Payer: BC Managed Care – PPO

## 2014-03-10 DIAGNOSIS — E291 Testicular hypofunction: Secondary | ICD-10-CM

## 2014-03-13 LAB — TESTOSTERONE, TOTAL, LC/MS: Testosterone, total: 216.7 ng/dL — ABNORMAL LOW (ref 348.0–1197.0)

## 2014-03-29 ENCOUNTER — Encounter: Payer: Self-pay | Admitting: *Deleted

## 2014-04-12 ENCOUNTER — Telehealth: Payer: Self-pay | Admitting: Endocrinology

## 2014-04-12 ENCOUNTER — Other Ambulatory Visit: Payer: Self-pay | Admitting: *Deleted

## 2014-04-12 MED ORDER — TESTOSTERONE 20.25 MG/1.25GM (1.62%) TD GEL: TRANSDERMAL | Status: DC

## 2014-04-12 NOTE — Telephone Encounter (Signed)
Patient states that he needs a refill on his Andrgel and needs 3 pumps   Pharmacy: [facility]   Thank you

## 2014-04-15 ENCOUNTER — Ambulatory Visit (INDEPENDENT_AMBULATORY_CARE_PROVIDER_SITE_OTHER): Payer: BC Managed Care – PPO | Admitting: Family

## 2014-04-15 ENCOUNTER — Encounter: Payer: Self-pay | Admitting: Family

## 2014-04-15 VITALS — BP 120/70 | HR 93 | Temp 98.5°F | Wt 267.0 lb

## 2014-04-15 DIAGNOSIS — L255 Unspecified contact dermatitis due to plants, except food: Secondary | ICD-10-CM

## 2014-04-15 DIAGNOSIS — F3289 Other specified depressive episodes: Secondary | ICD-10-CM

## 2014-04-15 DIAGNOSIS — F32A Depression, unspecified: Secondary | ICD-10-CM

## 2014-04-15 DIAGNOSIS — F329 Major depressive disorder, single episode, unspecified: Secondary | ICD-10-CM

## 2014-04-15 DIAGNOSIS — E291 Testicular hypofunction: Secondary | ICD-10-CM

## 2014-04-15 MED ORDER — BUPROPION HCL ER (XL) 300 MG PO TB24: 300.0000 mg | ORAL_TABLET | Freq: Every day | ORAL | Status: DC

## 2014-04-15 MED ORDER — METHYLPREDNISOLONE 4 MG PO KIT: PACK | ORAL | Status: AC

## 2014-04-15 NOTE — Patient Instructions (Signed)

## 2014-04-15 NOTE — Progress Notes (Signed)
Subjective:    Patient ID: Joe Francis, male    DOB: November 13, 1970, 43 y.o.   MRN: [id number]  HPI 43 year old white male, nonsmoker with a history of depression and is in today with 45 reports of feeling more down and depressed recently. Reports, will and is wearing off. He has a history of hypergonadism and is under the care of endocrinology for management. Denies any feelings of helplessness, hopelessness thoughts of death or dying.  Also reports having poison ivy on his left hand. Describes it as itchy and oriented. Reports trimming back the plant.  Review of Systems  Constitutional: Negative.   HENT: Negative.   Respiratory: Negative.   Cardiovascular: Negative.   Gastrointestinal: Negative.   Genitourinary: Negative.   Musculoskeletal: Negative.   Skin: Positive for rash.  Allergic/Immunologic: Negative.   Neurological: Negative.   Psychiatric/Behavioral: Negative.    Past Medical History  Diagnosis Date  . Fainting spell     History   Social History  . Marital Status: Married    Spouse Name: N/A    Number of Children: N/A  . 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 Smoking status: Never Smoker   . Smokeless tobacco: Never Used  . Alcohol Use: Yes     Comment: Rarely  . Drug Use: No  . Sexual Activity: Not on file   Other Topics Concern  . Not on file   Social History Narrative   5-6 hours of sleep per night   3 people living in the residence    No past surgical history on file.  Family History  Problem Relation Age of Onset  . Miscarriages / [place] Mother   . Cancer Mother   . Depression Father   . Diabetes Father   . Drug abuse Brother   . Hearing loss Brother   . Learning disabilities Brother   . Stroke Brother   . Cancer Maternal Grandfather   . Arthritis Paternal Grandmother   . Hypertension Neg Hx     No Known Allergies  Current Outpatient Prescriptions on File Prior  to Visit  Medication Sig Dispense Refill  . ibuprofen (ADVIL,MOTRIN) 200 MG tablet Take 200 mg by mouth every 6 (six) hours as needed for pain.      . Testosterone (ANDROGEL) 20.25 MG/1.25GM (1.62%) GEL Use 3 pumps on each arm daily  2.5 g  2   No current facility-administered medications on file prior to visit.    BP 120/70  Pulse 93  Temp(Src) 98.5 F (36.9 C) (Oral)  Wt 267 lb (121.11 kg)chart    Objective:   Physical Exam  Constitutional: He is oriented to person, place, and time. He appears well-developed and well-nourished.  Neck: Normal range of motion. Neck supple.  Cardiovascular: Normal rate, regular rhythm and normal heart sounds.   Pulmonary/Chest: Effort normal and breath sounds normal.  Neurological: He is alert and oriented to person, place, and time.  Skin: Rash noted. There is erythema.  Right hand  Psychiatric: He has a normal mood and affect.          Assessment & Plan:  Joe Francis was seen today for follow-up.  Diagnoses and associated orders for this visit:  Depression  Hypogonadism in male  Rhus dermatitis  Other Orders - Discontinue: buPROPion (WELLBUTRIN XL) 300 MG 24 hr tablet; Take 1 tablet (300 mg total) by mouth daily. - methylPREDNISolone (MEDROL DOSEPAK) 4 MG tablet; follow  package directions - buPROPion (WELLBUTRIN XL) 300 MG 24 hr tablet; Take 1 tablet (300 mg total) by mouth daily.    Recheck in 3 weeks to see if the increased dose of Wellbutrin is working effectively. Continue seeing endocrinologist as scheduled.

## 2014-06-28 ENCOUNTER — Encounter: Payer: Self-pay | Admitting: Endocrinology

## 2014-07-04 ENCOUNTER — Ambulatory Visit: Payer: BC Managed Care – PPO | Admitting: Endocrinology

## 2014-09-06 ENCOUNTER — Encounter: Payer: Self-pay | Admitting: Family

## 2014-09-06 ENCOUNTER — Ambulatory Visit (INDEPENDENT_AMBULATORY_CARE_PROVIDER_SITE_OTHER): Payer: Managed Care, Other (non HMO) | Admitting: Family

## 2014-09-06 VITALS — BP 98/60 | HR 67 | Temp 97.8°F | Wt 269.0 lb

## 2014-09-06 DIAGNOSIS — B351 Tinea unguium: Secondary | ICD-10-CM

## 2014-09-06 DIAGNOSIS — E291 Testicular hypofunction: Secondary | ICD-10-CM

## 2014-09-06 DIAGNOSIS — F32A Depression, unspecified: Secondary | ICD-10-CM

## 2014-09-06 DIAGNOSIS — F329 Major depressive disorder, single episode, unspecified: Secondary | ICD-10-CM

## 2014-09-06 LAB — BASIC METABOLIC PANEL
BUN: 11 mg/dL (ref 6–23)
CALCIUM: 8.9 mg/dL (ref 8.4–10.5)
CO2: 28 meq/L (ref 19–32)
CREATININE: 1.1 mg/dL (ref 0.4–1.5)
Chloride: 102 mEq/L (ref 96–112)
GFR: 76.78 mL/min (ref >60.00)
GLUCOSE: 90 mg/dL (ref 70–99)
Potassium: 4 mEq/L (ref 3.5–5.1)
Sodium: 135 mEq/L (ref 135–145)

## 2014-09-06 MED ORDER — CICLOPIROX 8 % EX SOLN: Freq: Every day | CUTANEOUS | Status: DC

## 2014-09-06 MED ORDER — BUPROPION HCL ER (XL) 300 MG PO TB24: 300.0000 mg | ORAL_TABLET | Freq: Every day | ORAL | Status: DC

## 2014-09-06 MED ORDER — TESTOSTERONE 20.25 MG/1.25GM (1.62%) TD GEL: TRANSDERMAL | Status: DC

## 2014-09-06 NOTE — Progress Notes (Signed)
Subjective:    Patient ID: Joe Francis, male    DOB: 18-Dec-1970, 44 y.o.   MRN: [id number]  HPI 44 year old white male, nonsmoker with a history of obstructive sleep apnea, hypogonadism, depression is in today for recheck. He's been off of medication 1 month due to not have any insurance benefits. He now has coverage and will like to be back on his medications. Tolerates Wellbutrin 300 mg once daily well and keeps his mood stable. Takes AndroGel 3 pumps on each arm that works well for hypergonadism. Although he's been off of his medication, reports being more irritable but overall depression is stable.  Has concerns of toenail fungus to the left great toe. Worse after he stopped his toe and caused the nail to avulse.   Review of Systems  Constitutional: Negative.   HENT: Negative.   Respiratory: Negative.   Cardiovascular: Negative.   Gastrointestinal: Negative.   Endocrine: Negative.   Genitourinary: Negative.   Musculoskeletal: Negative.   Skin: Negative.   Allergic/Immunologic: Negative.   Neurological: Negative.   Hematological: Negative.   Psychiatric/Behavioral: Negative.    Past Medical History  Diagnosis Date  . Fainting spell     History   Social History  . Marital Status: Married    Spouse Name: N/A    Number of Children: N/A  . 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 Smoking status: Never Smoker   . Smokeless tobacco: Never Used  . Alcohol Use: Yes     Comment: Rarely  . Drug Use: No  . Sexual Activity: Not on file   Other Topics Concern  . Not on file   Social History Narrative   5-6 hours of sleep per night   3 people living in the residence    History reviewed. No pertinent past surgical history.  Family History  Problem Relation Age of Onset  . Miscarriages / [place]   . Cancer Mother   . Depression Father   . Diabetes Father   . Drug abuse Brother   . Hearing  loss Brother   . Learning disabilities Brother   . Stroke Brother   . Cancer Maternal Grandfather   . Arthritis Paternal Grandmother   . Hypertension Neg Hx     No Known Allergies  Current Outpatient Prescriptions on File Prior to Visit  Medication Sig Dispense Refill  . ibuprofen (ADVIL,MOTRIN) 200 MG tablet Take 200 mg by mouth every 6 (six) hours as needed for pain.     No current facility-administered medications on file prior to visit.    BP 98/60 mmHg  Pulse 67  Temp(Src) 97.8 F (36.6 C) (Oral)  Wt 269 lb (122.018 kg)chart     Objective:   Physical Exam  Constitutional: He is oriented to person, place, and time. He appears well-developed and well-nourished.  HENT:  Right Ear: External ear normal.  Left Ear: External ear normal.  Nose: Nose normal.  Mouth/Throat: Oropharynx is clear and moist.  Neck: Normal range of motion. Neck supple. No thyromegaly present.  Cardiovascular: Normal rate, regular rhythm and normal heart sounds.   Pulmonary/Chest: Effort normal and breath sounds normal.  Abdominal: Soft. Bowel sounds are normal.  Musculoskeletal: Normal range of motion.  Neurological: He is alert and oriented to person, place, and time.  Skin: Skin is warm and dry.  Left great toenail avulsion. Nontender to palpation. Fungus noted.  Psychiatric: He has a  normal mood and affect.          Assessment & Plan:  Joe Francis was seen today for follow-up.  Diagnoses and associated orders for this visit:  Depression  Male hypogonadism - Basic Metabolic Panel  Nail fungal infection - Basic Metabolic Panel  Other Orders - buPROPion (WELLBUTRIN XL) 300 MG 24 hr tablet; Take 1 tablet (300 mg total) by mouth daily. - Testosterone (ANDROGEL) 20.25 MG/1.25GM (1.62%) GEL; Use 3 pumps on each arm daily - ciclopirox (PENLAC) 8 % solution; Apply topically at bedtime. Apply over nail and surrounding skin. Apply daily over previous coat. After seven (7) days, may remove  with alcohol and continue cycle.    Resume medications. See endocrinology as scheduled. Follow-up in 6 months and sooner as needed.

## 2014-09-06 NOTE — Progress Notes (Signed)
Pre visit review using our clinic review tool, if applicable. No additional management support is needed unless otherwise documented below in the visit note. 

## 2014-09-06 NOTE — Patient Instructions (Signed)
Ringworm, Nail A fungal infection of the nail (tinea unguium/onychomycosis) is common. It is common as the visible part of the nail is composed of dead cells which have no blood supply to help prevent infection. It occurs because fungi are everywhere and will pick any opportunity to grow on any dead material. Because nails are very slow growing they require up to 2 years of treatment with anti-fungal medications. The entire nail back to the base is infected. This includes approximately  of the nail which you cannot see. If your caregiver has prescribed a medication by mouth, take it every day and as directed. No progress will be seen for at least 6 to 9 months. Do not be disappointed! Because fungi live on dead cells with little or no exposure to blood supply, medication delivery to the infection is slow; thus the cure is slow. It is also why you can observe no progress in the first 6 months. The nail becoming cured is the base of the nail, as it has the blood supply. Topical medication such as creams and ointments are usually not effective. Important in successful treatment of nail fungus is closely following the medication regimen that your doctor prescribes. Sometimes you and your caregiver may elect to speed up this process by surgical removal of all the nails. Even this may still require 6 to 9 months of additional oral medications. See your caregiver as directed. Remember there will be no visible improvement for at least 6 months. See your caregiver sooner if other signs of infection (redness and swelling) develop. Document Released: 08/09/2000 Document Revised: 11/04/2011 Document Reviewed: 10/18/2008 ExitCare Patient Information 2015 [employer]. This information is not intended to replace advice given to you by your health care provider. Make sure you discuss any questions you have with your health care provider.  

## 2014-09-09 ENCOUNTER — Telehealth: Payer: Self-pay | Admitting: Family

## 2014-09-09 NOTE — Telephone Encounter (Signed)
PA for Androgel was denied.  Patient's plan requires patient to try and fail Androderm and Axiron first.

## 2014-09-09 NOTE — Telephone Encounter (Signed)
Follow up with urology

## 2014-09-09 NOTE — Telephone Encounter (Signed)
Left message to advise pt RX not covered and to f/u with urology

## 2014-09-12 ENCOUNTER — Other Ambulatory Visit: Payer: Self-pay | Admitting: *Deleted

## 2014-09-12 ENCOUNTER — Other Ambulatory Visit (INDEPENDENT_AMBULATORY_CARE_PROVIDER_SITE_OTHER): Payer: Managed Care, Other (non HMO)

## 2014-09-12 DIAGNOSIS — E291 Testicular hypofunction: Secondary | ICD-10-CM

## 2014-09-12 LAB — TESTOSTERONE: Testosterone: 191.03 ng/dL — ABNORMAL LOW (ref 300.00–890.00)

## 2014-09-13 ENCOUNTER — Other Ambulatory Visit: Payer: Self-pay

## 2014-09-16 ENCOUNTER — Ambulatory Visit: Payer: Self-pay | Admitting: Endocrinology

## 2014-10-03 ENCOUNTER — Ambulatory Visit (INDEPENDENT_AMBULATORY_CARE_PROVIDER_SITE_OTHER): Payer: Managed Care, Other (non HMO) | Admitting: Endocrinology

## 2014-10-03 ENCOUNTER — Encounter: Payer: Self-pay | Admitting: Endocrinology

## 2014-10-03 VITALS — BP 135/80 | HR 71 | Temp 98.3°F | Resp 16 | Ht 75.0 in | Wt 269.4 lb

## 2014-10-03 DIAGNOSIS — E291 Testicular hypofunction: Secondary | ICD-10-CM

## 2014-10-03 NOTE — Progress Notes (Signed)
Patient ID: Joe Francis, male   DOB: November 01, 1970, 44 y.o.   MRN: [id number]           Chief complaint: Follow-up of hypogonadism  History of Present Illness:  Hypogonadismwas initially diagnosed in 2013  He had at diagnosis  complaints of fatigue, lack of energy, decreased libido, decreased motivation. He had a low testosterone level in 2013 and was not evaluated with further labs He was initially tried on AndroGel but because of lack of increase in testosterone level he was started on testosterone injections.  He thinks that overall his symptoms improved with the injections His injection regimen was adjusted based on his testosterone level and he was taking regimens ranging from 200 mg every 2 weeks to 100 mg weekly. However with even a weekly injection he would have improved libido and energy only for 4 or 5 days With increasing his dosage his level went up to 892 and his dose was reduced somewhat. However with the injections his testosterone levels had fluctuated between 97 up to 1000 His testosterone level with urologist was 202 and he was evaluated further with LH, FSH, prolactin and thyroid functions which were normal. He was tried on clomiphene, initially 25 mg daily which did not help him subjectively even though his testosterone level had improved to 251 along with a free testosterone level of 69 compared to 57 at baseline  Recent history: Because of relatively low  testosterone levels with Clomid and the need for long-term supplementation he was switched to AndroGel in 4/15. He had used 2 pumps on each arm initially and the dose was adjusted in 2015 and was last on 3 pumps a day  With AndroGel he started feeling more energy as well as improved libido  Thyroid because of lack of insurance he has not taken any AndroGel for the last 5 months or so He again feeling tired and at the end of the day he has little energy and feels like taking a nap. Also has decreased libido  now  Baselinetestosterone level was 199 but has been as low as 69 at the lowest point   Lab Results  Component Value Date   TESTOSTERONE 191.03* 09/12/2014          Medication List       This list is accurate as of: 10/03/14 11:52 AM.  Always use your most recent med list.               buPROPion 300 MG 24 hr tablet  Commonly known as:  WELLBUTRIN XL  Take 1 tablet (300 mg total) by mouth daily.     ciclopirox 8 % solution  Commonly known as:  PENLAC  Apply topically at bedtime. Apply over nail and surrounding skin. Apply daily over previous coat. After seven (7) days, may remove with alcohol and continue cycle.     ibuprofen 200 MG tablet  Commonly known as:  ADVIL,MOTRIN  Take 200 mg by mouth every 6 (six) hours as needed for pain.     Testosterone 20.25 MG/1.25GM (1.62%) Gel  Commonly known as:  ANDROGEL  Use 3 pumps on each arm daily        Allergies: No Known Allergies  Past Medical History  Diagnosis Date  . Fainting spell     No past surgical history on file.  Family History  Problem Relation Age of Onset  . Miscarriages / [place] Mother   . Cancer Mother   . Depression Father   .  Diabetes Father   . Drug abuse Brother   . Hearing loss Brother   . Learning disabilities Brother   . Stroke Brother   . Cancer Maternal Grandfather   . Arthritis Paternal Grandmother   . Hypertension Neg Hx     Social History:  reports that he has never smoked. He has never used smokeless tobacco. He reports that he drinks alcohol. He reports that he does not use illicit drugs.  ROS  No history of abnormal fasting glucose or diabetes, glucose below 100  He continues to take Wellbutrin for depression  His sleep is usually fairly good.  Has not been treated for sleep apnea as this apparently is present only when he is lying on his back, he sleeps on his side  Exercise: Walking or running 3 times a week. Has not had any weight change recently  Wt  Readings from Last 3 Encounters:  10/03/14 269 lb 6.4 oz (122.199 kg)  09/06/14 269 lb (122.018 kg)  04/15/14 267 lb (121.11 kg)    General Examination:   BP 135/80 mmHg  Pulse 71  Temp(Src) 98.3 F (36.8 C)  Resp 16  Ht  (1.905 m)  Wt 269 lb 6.4 oz (122.199 kg)  BMI 33.67 kg/m2  SpO2 98%    Assessment/ Plan:   Hypogonadism,  hypogonadotropic and long-standing  He has been symptomatic from this and had subjectively done better with supplementation Now that he has been unable to get his AndroGel he is having fatigue and decreased libido again  Since he has been asked by his insurance of need to switch to Axiron will try 1 pump on each arm. Discussed application process and he will try to get the co-pay card online  He will come back for testosterone level in followup in 6 weeks and followup in 4 months   Jacky Dross 10/03/2014, 11:52 AM

## 2014-10-03 NOTE — Patient Instructions (Signed)
Go to [email or web address] for info

## 2014-10-04 ENCOUNTER — Telehealth: Payer: Self-pay | Admitting: Endocrinology

## 2014-10-04 NOTE — Telephone Encounter (Signed)
[facility] rx [facility] [address]

## 2014-10-05 ENCOUNTER — Other Ambulatory Visit: Payer: Self-pay | Admitting: *Deleted

## 2014-10-05 MED ORDER — TESTOSTERONE 30 MG/ACT TD SOLN: TRANSDERMAL | Status: DC

## 2014-10-05 NOTE — Telephone Encounter (Signed)
Apply 1 pump under each arm

## 2014-10-05 NOTE — Telephone Encounter (Signed)
Please see below and advise of dose and if okay to send?

## 2014-10-12 ENCOUNTER — Telehealth: Payer: Self-pay | Admitting: Endocrinology

## 2014-10-12 NOTE — Telephone Encounter (Signed)
Pt wanted to know if med has been authorized by insurance yet

## 2014-10-12 NOTE — Telephone Encounter (Signed)
PA was completed and faxed to [facility]. Called pt and lvm advising him that Dr Lucianne MussKumar is on vacation and that we have done a PA and faxed it to [facility],

## 2014-11-14 ENCOUNTER — Other Ambulatory Visit (INDEPENDENT_AMBULATORY_CARE_PROVIDER_SITE_OTHER): Payer: Managed Care, Other (non HMO)

## 2014-11-14 DIAGNOSIS — E291 Testicular hypofunction: Secondary | ICD-10-CM

## 2014-11-14 LAB — TESTOSTERONE: TESTOSTERONE: 401.47 ng/dL (ref 300.00–890.00)

## 2015-02-02 ENCOUNTER — Other Ambulatory Visit: Payer: Self-pay | Admitting: *Deleted

## 2015-02-02 ENCOUNTER — Encounter: Payer: Self-pay | Admitting: Endocrinology

## 2015-02-02 ENCOUNTER — Ambulatory Visit (INDEPENDENT_AMBULATORY_CARE_PROVIDER_SITE_OTHER): Payer: Managed Care, Other (non HMO) | Admitting: Endocrinology

## 2015-02-02 VITALS — BP 122/86 | HR 84 | Temp 97.8°F | Resp 16 | Ht 75.0 in | Wt 266.8 lb

## 2015-02-02 DIAGNOSIS — E291 Testicular hypofunction: Secondary | ICD-10-CM | POA: Diagnosis not present

## 2015-02-02 LAB — TESTOSTERONE: Testosterone: 685.79 ng/dL (ref 300.00–890.00)

## 2015-02-02 MED ORDER — TESTOSTERONE 30 MG/ACT TD SOLN: TRANSDERMAL | Status: DC

## 2015-02-02 NOTE — Progress Notes (Signed)
Patient ID: Joe Francis, male   DOB: 07-17-1971, 44 y.o.   MRN: [id number]           Chief complaint: Follow-up of hypogonadism  History of Present Illness:  Hypogonadismwas initially diagnosed in 2013  He had at diagnosis  complaints of fatigue, lack of energy, decreased libido, decreased motivation. He had a low testosterone level in 2013 and was not evaluated with further labs He was initially tried on AndroGel but because of lack of increase in testosterone level he was started on testosterone injections.  He thinks that overall his symptoms improved with the injections His injection regimen was adjusted based on his testosterone level and he was taking regimens ranging from 200 mg every 2 weeks to 100 mg weekly. However with even a weekly injection he would have improved libido and energy only for 4 or 5 days With increasing his dosage his level went up to 892 and his dose was reduced somewhat. However with the injections his testosterone levels had fluctuated between 97 up to 1000 His testosterone level with urologist was 202 and he was evaluated further with LH, FSH, prolactin and thyroid functions which were normal. He was tried on clomiphene, initially 25 mg daily which did not help him subjectively even though his testosterone level had improved to 251 along with a free testosterone level of 69 compared to 57 at baseline  Recent history: Because of relatively low  testosterone levels with Clomid and the need for long-term supplementation he was switched to AndroGel in 4/15. This was again switched to Axiron in 1/16 because of insurance not covering it He is able to use this consistently and without any difficulty in the application.  Currently using 1 pump under each arm daily Follow-up level in March was excellent However he still thinks he is not feeling slightly back to normal with his energy level or libido although he is better overall Does not complain of any decreased  motivation or depression  Baselinetestosterone level was 199 but has been as low as 69 at the lowest point   Lab Results  Component Value Date   TESTOSTERONE 401.47 11/14/2014          Medication List       This list is accurate as of: 02/02/15 10:01 AM.  Always use your most recent med list.               buPROPion 300 MG 24 hr tablet  Commonly known as:  WELLBUTRIN XL  Take 1 tablet (300 mg total) by mouth daily.     ciclopirox 8 % solution  Commonly known as:  PENLAC  Apply topically at bedtime. Apply over nail and surrounding skin. Apply daily over previous coat. After seven (7) days, may remove with alcohol and continue cycle.     ibuprofen 200 MG tablet  Commonly known as:  ADVIL,MOTRIN  Take 200 mg by mouth every 6 (six) hours as needed for pain.     Testosterone 30 MG/ACT Soln  Apply 1 pump under each arm        Allergies: No Known Allergies  Past Medical History  Diagnosis Date  . Fainting spell     No past surgical history on file.  Family History  Problem Relation Age of Onset  . Miscarriages / [place] Mother   . Cancer Mother   . Depression Father   . Diabetes Father   . Drug abuse Brother   . Hearing loss Brother   .  Learning disabilities Brother   . Stroke Brother   . Cancer Maternal Grandfather   . Arthritis Paternal Grandmother   . Hypertension Neg Hx     Social History:  reports that he has never smoked. He has never used smokeless tobacco. He reports that he drinks alcohol. He reports that he does not use illicit drugs.  ROS  He continues to take Wellbutrin for depression  Has not been treated for sleep apnea as this apparently is present only when he is lying on his back, he sleeps on his side.  May feel sleepy late in the afternoon  Exercise: Walking or running 3 times a week. Has lost 3 pounds  Wt Readings from Last 3 Encounters:  02/02/15 266 lb 12.8 oz (121.02 kg)  10/03/14 269 lb 6.4 oz (122.199 kg)    09/06/14 269 lb (122.018 kg)    General Examination:   BP 122/86 mmHg  Pulse 84  Temp(Src) 97.8 F (36.6 C)  Resp 16  Ht  (1.905 m)  Wt 266 lb 12.8 oz (121.02 kg)  BMI 33.35 kg/m2  SpO2 98%   Assessment/ Plan:   Hypogonadism,  hypogonadotropic and long-standing  He has been symptomatic from this and had subjectively done better with supplementation Now that he has been able to use the Axiron on his insurance he has been regular with taking this His fatigue and libido are improving although he is still complaining of not feeling back to normal with his energy level; however he is able to exercise at least 3 times a week without difficulty He thinks his decreased libido may be unrelated  We will check his testosterone level today and adjust the dose as needed  He will come back for testosterone level and office visit in 6 months  Advised him to follow-up with PCP especially with his mildly increased blood pressure today on the second measurement   Geryl Dohn 02/02/2015, 10:01 AM

## 2015-03-19 ENCOUNTER — Other Ambulatory Visit: Payer: Self-pay | Admitting: Family

## 2015-03-22 ENCOUNTER — Other Ambulatory Visit: Payer: Self-pay | Admitting: Family

## 2015-05-10 ENCOUNTER — Encounter: Payer: Self-pay | Admitting: Family

## 2015-05-10 ENCOUNTER — Ambulatory Visit (INDEPENDENT_AMBULATORY_CARE_PROVIDER_SITE_OTHER): Payer: Managed Care, Other (non HMO) | Admitting: Family

## 2015-05-10 VITALS — BP 114/80 | Temp 97.7°F | Wt 276.3 lb

## 2015-05-10 DIAGNOSIS — E663 Overweight: Secondary | ICD-10-CM

## 2015-05-10 DIAGNOSIS — L570 Actinic keratosis: Secondary | ICD-10-CM | POA: Diagnosis not present

## 2015-05-10 DIAGNOSIS — F411 Generalized anxiety disorder: Secondary | ICD-10-CM

## 2015-05-10 DIAGNOSIS — B351 Tinea unguium: Secondary | ICD-10-CM

## 2015-05-10 DIAGNOSIS — F32A Depression, unspecified: Secondary | ICD-10-CM | POA: Insufficient documentation

## 2015-05-10 DIAGNOSIS — F329 Major depressive disorder, single episode, unspecified: Secondary | ICD-10-CM

## 2015-05-10 LAB — CBC WITH DIFFERENTIAL/PLATELET
Basophils Absolute: 0 10*3/uL (ref 0.0–0.1)
Basophils Relative: 0.3 % (ref 0.0–3.0)
Eosinophils Absolute: 0.1 10*3/uL (ref 0.0–0.7)
Eosinophils Relative: 2 % (ref 0.0–5.0)
HCT: 47.9 % (ref 39.0–52.0)
Hemoglobin: 16.2 g/dL (ref 13.0–17.0)
Lymphocytes Relative: 24.3 % (ref 12.0–46.0)
Lymphs Abs: 1.6 10*3/uL (ref 0.7–4.0)
MCHC: 33.8 g/dL (ref 30.0–36.0)
MCV: 85.6 fl (ref 78.0–100.0)
Monocytes Absolute: 0.5 10*3/uL (ref 0.1–1.0)
Monocytes Relative: 7.2 % (ref 3.0–12.0)
Neutro Abs: 4.3 10*3/uL (ref 1.4–7.7)
Neutrophils Relative %: 66.2 % (ref 43.0–77.0)
Platelets: 239 10*3/uL (ref 150.0–400.0)
RBC: 5.6 Mil/uL (ref 4.22–5.81)
RDW: 15.1 % (ref 11.5–15.5)
WBC: 6.4 10*3/uL (ref 4.0–10.5)

## 2015-05-10 LAB — LIPID PANEL
Cholesterol: 206 mg/dL — ABNORMAL HIGH (ref 0–200)
HDL: 38.9 mg/dL — ABNORMAL LOW (ref >39.00)
LDL Cholesterol: 133 mg/dL — ABNORMAL HIGH (ref 0–99)
NonHDL: 167.52
Total CHOL/HDL Ratio: 5
Triglycerides: 171 mg/dL — ABNORMAL HIGH (ref 0.0–149.0)
VLDL: 34.2 mg/dL (ref 0.0–40.0)

## 2015-05-10 LAB — COMPREHENSIVE METABOLIC PANEL
ALT: 27 U/L (ref 0–53)
AST: 23 U/L (ref 0–37)
Albumin: 4.5 g/dL (ref 3.5–5.2)
Alkaline Phosphatase: 73 U/L (ref 39–117)
BUN: 12 mg/dL (ref 6–23)
CO2: 27 mEq/L (ref 19–32)
Calcium: 9.4 mg/dL (ref 8.4–10.5)
Chloride: 105 mEq/L (ref 96–112)
Creatinine, Ser: 1.21 mg/dL (ref 0.40–1.50)
GFR: 68.57 mL/min (ref >60.00)
Glucose, Bld: 98 mg/dL (ref 70–99)
Potassium: 4.7 mEq/L (ref 3.5–5.1)
Sodium: 142 mEq/L (ref 135–145)
Total Bilirubin: 0.5 mg/dL (ref 0.2–1.2)
Total Protein: 7.1 g/dL (ref 6.0–8.3)

## 2015-05-10 LAB — TSH: TSH: 2.03 u[IU]/mL (ref 0.35–4.50)

## 2015-05-10 LAB — PSA: PSA: 0.38 ng/mL (ref 0.10–4.00)

## 2015-05-10 MED ORDER — CICLOPIROX 8 % EX SOLN: Freq: Every day | CUTANEOUS | Status: DC

## 2015-05-10 MED ORDER — BUPROPION HCL ER (XL) 300 MG PO TB24: ORAL_TABLET | ORAL | Status: DC

## 2015-05-10 NOTE — Patient Instructions (Signed)
Exercise to Stay Healthy Exercise helps you become and stay healthy. EXERCISE IDEAS AND TIPS Choose exercises that:  You enjoy.  Fit into your day. You do not need to exercise really hard to be healthy. You can do exercises at a slow or medium level and stay healthy. You can:  Stretch before and after working out.  Try yoga, Pilates, or tai chi.  Lift weights.  Walk fast, swim, jog, run, climb stairs, bicycle, dance, or rollerskate.  Take aerobic classes. Exercises that burn about 150 calories:  Running 1  miles in 15 minutes.  Playing volleyball for 45 to 60 minutes.  Washing and waxing a car for 45 to 60 minutes.  Playing touch football for 45 minutes.  Walking 1  miles in 35 minutes.  Pushing a stroller 1  miles in 30 minutes.  Playing basketball for 30 minutes.  Raking leaves for 30 minutes.  Bicycling 5 miles in 30 minutes.  Walking 2 miles in 30 minutes.  Dancing for 30 minutes.  Shoveling snow for 15 minutes.  Swimming laps for 20 minutes.  Walking up stairs for 15 minutes.  Bicycling 4 miles in 15 minutes.  Gardening for 30 to 45 minutes.  Jumping rope for 15 minutes.  Washing windows or floors for 45 to 60 minutes. Document Released: 09/14/2010 Document Revised: 11/04/2011 Document Reviewed: 09/14/2010 ExitCare Patient Information 2015 [employer]. This information is not intended to replace advice given to you by your health care provider. Make sure you discuss any questions you have with your health care provider.  

## 2015-05-10 NOTE — Progress Notes (Signed)
Subjective:    Patient ID: Joe Francis, male    DOB: 1970/12/05, 44 y.o.   MRN: [id number]  HPI 44 year old Caucasian male seen today for medication follow-up, elevated mole on left forearm, and request completion of DMV paperwork.  Patient has a past medical history of  anxiety, hypogonadism, obstructive sleep apnea, and overweight. He noticed a few days ago that a long-term mole on his left arm was elevated after he shaved the hair off of his arm. The patient denies pain, changes in shape, or color of mole.   Patient also requests, DMV paperwork to be completed regarding patient's current state of health.  He requests refills for Bupropion and ciclopirox today.  He denies any other issues or concerns.   Review of Systems  Constitutional: Negative.   HENT: Negative.   Eyes: Negative.   Respiratory: Negative.   Cardiovascular: Negative.   Gastrointestinal: Negative.   Endocrine: Negative.   Genitourinary: Negative.   Musculoskeletal: Negative.   Skin: Negative for color change, pallor, rash and wound.       See HPI   Allergic/Immunologic: Negative.   Neurological: Negative.   Hematological: Negative.   Psychiatric/Behavioral: Negative.    Past Medical History  Diagnosis Date  . Fainting spell     Social History   Social History  . Marital Status: Married    Spouse Name: N/A  . Number of Children: N/A  . 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 Smoking status: Never Smoker   . Smokeless tobacco: Never Used  . Alcohol Use: Yes     Comment: Rarely  . Drug Use: No  . Sexual Activity: Not on file   Other Topics Concern  . Not on file   Social History Narrative   5-6 hours of sleep per night   3 people living in the residence    History reviewed. No pertinent past surgical history.  Family History  Problem Relation Age of Onset  . Miscarriages / [place] Mother   . Cancer Mother   .  Depression Father   . Diabetes Father   . Drug abuse Brother   . Hearing loss Brother   . Learning disabilities Brother   . Stroke Brother   . Cancer Maternal Grandfather   . Arthritis Paternal Grandmother   . Hypertension Neg Hx     No Known Allergies  Current Outpatient Prescriptions on File Prior to Visit  Medication Sig Dispense Refill  . ibuprofen (ADVIL,MOTRIN) 200 MG tablet Take 200 mg by mouth every 6 (six) hours as needed for pain.    . Testosterone 30 MG/ACT SOLN Apply 1 pump under each arm 90 mL 3   No current facility-administered medications on file prior to visit.    BP 114/80 mmHg  Temp(Src) 97.7 F (36.5 C) (Oral)  Wt 276 lb 4.8 oz (125.329 kg)chart    Objective:   Physical Exam  Constitutional: He is oriented to person, place, and time. He appears well-developed and well-nourished.  HENT:  Head: Normocephalic.  Right Ear: External ear normal.  Left Ear: External ear normal.  Nose: Nose normal.  Mouth/Throat: Oropharynx is clear and moist.  Eyes: Conjunctivae and EOM are normal. Pupils are equal, round, and reactive to light.  Neck: Normal range of motion. Neck supple.  Cardiovascular: Normal rate, regular rhythm, normal heart sounds and intact distal pulses.   Pulmonary/Chest: Effort normal and breath sounds  normal.  Musculoskeletal: Normal range of motion.  Neurological: He is alert and oriented to person, place, and time.  Skin: Skin is warm and dry.  Light brown colored, palpable mole on left anterior forearm with elevation. Upon illumination no visible evidence of vascular vessels present.  Psychiatric: He has a normal mood and affect. His behavior is normal. Judgment and thought content normal.  Nursing note and vitals reviewed.     Actinic Keratosis, right arm: Informed consent was obtained and the site was treated with 20 % acetic acid. The color of the lesion changed to white with the application of acid. The patient tolerated the procedure  well and  aftercare instructions were given to the patient.     Assessment & Plan:  .Joe Francis was seen today for follow-up.  Diagnoses and all orders for this visit:  Anxiety state -     TSH  Actinic keratoses -     CBC with Differential -     CMP -     PSA -     TSH  Fungal nail infection  Overweight -     Lipid Panel  Depression  Other orders -     buPROPion (WELLBUTRIN XL) 300 MG 24 hr tablet; TAKE 1 TABLET (300 MG TOTAL) BY MOUTH DAILY. -     ciclopirox (PENLAC) 8 % solution; Apply topically at bedtime. Apply over nail and surrounding skin. Apply daily over previous coat. After seven (7) days, may remove with alcohol and continue cycle.   Upon receipt of lab results will complete and submit DMV paperwork. Will follow-up with patient regarding labs. Refills submitted to pharmacy on file. Patient due for a physical exam.

## 2015-05-10 NOTE — Progress Notes (Signed)
Pre visit review using our clinic review tool, if applicable. No additional management support is needed unless otherwise documented below in the visit note. 

## 2015-05-10 NOTE — Progress Notes (Signed)
Correction mole is located on the anterior right forearm.

## 2015-05-18 ENCOUNTER — Ambulatory Visit (INDEPENDENT_AMBULATORY_CARE_PROVIDER_SITE_OTHER): Payer: Managed Care, Other (non HMO) | Admitting: Family

## 2015-05-18 ENCOUNTER — Encounter: Payer: Self-pay | Admitting: Family

## 2015-05-18 ENCOUNTER — Telehealth: Payer: Self-pay | Admitting: *Deleted

## 2015-05-18 ENCOUNTER — Telehealth: Payer: Self-pay | Admitting: Endocrinology

## 2015-05-18 VITALS — BP 130/88 | HR 85 | Temp 98.1°F | Ht 74.0 in | Wt 277.5 lb

## 2015-05-18 DIAGNOSIS — E291 Testicular hypofunction: Secondary | ICD-10-CM | POA: Diagnosis not present

## 2015-05-18 DIAGNOSIS — F32A Depression, unspecified: Secondary | ICD-10-CM

## 2015-05-18 DIAGNOSIS — F329 Major depressive disorder, single episode, unspecified: Secondary | ICD-10-CM

## 2015-05-18 DIAGNOSIS — Z0000 Encounter for general adult medical examination without abnormal findings: Secondary | ICD-10-CM | POA: Diagnosis not present

## 2015-05-18 NOTE — Telephone Encounter (Signed)
Please see below and advise.

## 2015-05-18 NOTE — Telephone Encounter (Signed)
Joe Francis spoke with patient and patient is aware paperwork is upfront

## 2015-05-18 NOTE — Telephone Encounter (Signed)
Attempted to call patient again. No answer. 

## 2015-05-18 NOTE — Telephone Encounter (Signed)
Noted, patient is aware. 

## 2015-05-18 NOTE — Telephone Encounter (Signed)
Does he mean he is having fatigue again?  If this is so he can go back to 1 pump under each arm instead of 1 pump daily.  He will need follow-up testosterone level in about a month

## 2015-05-18 NOTE — Telephone Encounter (Signed)
Patient came in for office visit today. Did not give his copy of DMV paperwork but did confirm paperwork was faxed. Called patient to let know his copy is upfront ready to be picked up. Patient does not have voicemail setup on phone. Will re-attempt later.

## 2015-05-18 NOTE — Progress Notes (Signed)
Pre visit review using our clinic review tool, if applicable. No additional management support is needed unless otherwise documented below in the visit note. 

## 2015-05-18 NOTE — Telephone Encounter (Signed)
Patient called stating that he feels with his dosage change that he is going backwards  He would like to know what to do? Should he come in for labs? Office visit?  Please advise patient    Thank You

## 2015-05-18 NOTE — Progress Notes (Signed)
Subjective:    Patient ID: Joe Francis, male    DOB: [birth year], 44 y.o.   MRN: [id number]  HPI 44 year old male with a history of hyperlipidemia, hypogonadism, depression, and obesity is in today for CPX. Denies any concerns. Is seeing Dr. Lucianne Muss for management of hypogonadism.   Patient presents for yearly preventative medicine examination. Medicare questionnaire was completed  All immunizations and health maintenance protocols were reviewed with the patient and needed orders were placed.  Appropriate screening laboratory values were ordered for the patient including screening of hyperlipidemia, renal function and hepatic function.  Medication reconciliation,  past medical history, social history, problem list and allergies were reviewed in detail with the patient  Goals were established with regard to weight loss, exercise, and  diet in compliance with medications      Review of Systems  Constitutional: Negative.   HENT: Negative.   Eyes: Negative.   Respiratory: Negative.   Cardiovascular: Negative.   Gastrointestinal: Negative.   Endocrine: Negative.   Genitourinary: Negative.   Musculoskeletal: Negative.   Skin: Negative.   Allergic/Immunologic: Negative.   Neurological: Negative.   Hematological: Negative.   Psychiatric/Behavioral: Negative.    Past Medical History  Diagnosis Date  . Fainting spell     Social History   Social History  . Marital Status: Married    Spouse Name: N/A  . Number of Children: N/A  . 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 Smoking status: Never Smoker   . Smokeless tobacco: Never Used  . Alcohol Use: Yes     Comment: Rarely  . Drug Use: No  . Sexual Activity: Not on file   Other Topics Concern  . Not on file   Social History Narrative   5-6 hours of sleep per night   3 people living in the residence    No past surgical history on file.  Family  History  Problem Relation Age of Onset  . Miscarriages / [place] Mother   . Cancer Mother   . Depression Father   . Diabetes Father   . Drug abuse Brother   . Hearing loss Brother   . Learning disabilities Brother   . Stroke Brother   . Cancer Maternal Grandfather   . Arthritis Paternal Grandmother   . Hypertension Neg Hx     No Known Allergies  Current Outpatient Prescriptions on File Prior to Visit  Medication Sig Dispense Refill  . buPROPion (WELLBUTRIN XL) 300 MG 24 hr tablet TAKE 1 TABLET (300 MG TOTAL) BY MOUTH DAILY. 90 tablet 1  . ciclopirox (PENLAC) 8 % solution Apply topically at bedtime. Apply over nail and surrounding skin. Apply daily over previous coat. After seven (7) days, may remove with alcohol and continue cycle. 6.6 mL 0  . ibuprofen (ADVIL,MOTRIN) 200 MG tablet Take 200 mg by mouth every 6 (six) hours as needed for pain.    . Testosterone 30 MG/ACT SOLN Apply 1 pump under each arm 90 mL 3   No current facility-administered medications on file prior to visit.    BP 130/88 mmHg  Pulse 85  Temp(Src) 98.1 F (36.7 C) (Oral)  Ht  (1.88 m)  Wt 277 lb 8 oz (125.873 kg)  BMI 35.61 kg/m2  SpO2 96%chart     Objective:   Physical Exam  Constitutional: He is oriented to person, place, and time. He appears well-developed and well-nourished.  HENT:  Head: Normocephalic.  Right Ear: External ear normal.  Left Ear: External ear normal.  Nose: Nose normal.  Mouth/Throat: Oropharynx is clear and moist.  Eyes: Conjunctivae and EOM are normal. Pupils are equal, round, and reactive to light.  Neck: Normal range of motion. Neck supple. No thyromegaly present.  Cardiovascular: Normal rate, regular rhythm and normal heart sounds.   Pulmonary/Chest: Effort normal and breath sounds normal.  Abdominal: Soft. Bowel sounds are normal.  Musculoskeletal: Normal range of motion.  Neurological: He is alert and oriented to person, place, and time. He has normal  reflexes.  Skin: Skin is warm and dry.  Psychiatric: He has a normal mood and affect.          Assessment & Plan:  Joe Francis was seen today for follow-up.  Diagnoses and all orders for this visit:  Preventative health care  Hypogonadism in male  Depression   Call the office with any questions or concerns. Follow-up with Dr. Lucianne Muss as scheduled. Encouraged healthy diet and exercise, weight reduction. Recheck in 6 months and sooner as needed.

## 2015-06-10 ENCOUNTER — Other Ambulatory Visit: Payer: Self-pay | Admitting: Family

## 2015-06-21 ENCOUNTER — Other Ambulatory Visit: Payer: Self-pay | Admitting: Family

## 2015-07-21 ENCOUNTER — Other Ambulatory Visit: Payer: Self-pay | Admitting: Endocrinology

## 2015-07-27 ENCOUNTER — Encounter: Payer: Managed Care, Other (non HMO) | Admitting: Endocrinology

## 2015-07-27 DIAGNOSIS — E291 Testicular hypofunction: Secondary | ICD-10-CM

## 2015-07-27 LAB — TESTOSTERONE: Testosterone: 404.15 ng/dL (ref 300.00–890.00)

## 2015-07-31 ENCOUNTER — Ambulatory Visit: Payer: Managed Care, Other (non HMO)

## 2015-08-03 ENCOUNTER — Encounter: Payer: Self-pay | Admitting: Endocrinology

## 2015-08-03 ENCOUNTER — Ambulatory Visit (INDEPENDENT_AMBULATORY_CARE_PROVIDER_SITE_OTHER): Payer: Managed Care, Other (non HMO) | Admitting: Endocrinology

## 2015-08-03 VITALS — BP 130/84 | HR 72 | Temp 98.4°F | Resp 14 | Ht 74.0 in | Wt 278.4 lb

## 2015-08-03 DIAGNOSIS — E291 Testicular hypofunction: Secondary | ICD-10-CM

## 2015-08-03 MED ORDER — TADALAFIL 20 MG PO TABS: 20.0000 mg | ORAL_TABLET | Freq: Every day | ORAL | Status: DC | PRN

## 2015-08-03 NOTE — Progress Notes (Signed)
Patient ID: Joe Francis, male   DOB: [birth year], 44 y.o.   MRN: [id number]           Chief complaint: Follow-up of hypogonadism  History of Present Illness:  Hypogonadismwas initially diagnosed in 2013  He had at diagnosis  complaints of fatigue, lack of energy, decreased libido, decreased motivation. He had a low testosterone level in 2013 and was not evaluated with further labs He was initially tried on AndroGel but because of lack of increase in testosterone level he was started on testosterone injections.  He thinks that overall his symptoms improved with the injections His injection regimen was adjusted based on his testosterone level and he was taking regimens ranging from 200 mg every 2 weeks to 100 mg weekly. However with even a weekly injection he would have improved libido and energy only for 4 or 5 days With increasing his dosage his level went up to 892 and his dose was reduced somewhat. However with the injections his testosterone levels had fluctuated between 97 up to 1000 His testosterone level with urologist was 202 and he was evaluated further with LH, FSH, prolactin and thyroid functions which were normal. He was tried on clomiphene, initially 25 mg daily which did not help him subjectively even though his testosterone level had improved to 251 along with a free testosterone level of 69 compared to 57 at baseline  Recent history: Since he had low  testosterone levels with Clomid and the need for long-term supplementation he was switched to AndroGel in 4/15. This was again switched to Axiron in 1/16 because of insurance not covering it He is able to use this consistently and without any difficulty in the application.   Currently using 1 pump under each arm daily  Because of his level being almost 700 in 6/16 he was told to reduce the dose to 1 pump only per day but he called back in September saying that he was started and feels much more tired and exhausted The dosage  was increased again to 1 pump under each arm and he is now here for follow-up  He feels better with his energy level and libido and also has better motivation and mood  Baselinetestosterone level was 199 but has been as low as 69 at the lowest point  His current level is back to normal  Lab Results  Component Value Date   TESTOSTERONE 404.15 07/27/2015   TESTOSTERONE 685.79 02/02/2015   TESTOSTERONE 401.47 11/14/2014   TESTOSTERONE 191.03* 09/12/2014          Medication List       This list is accurate as of: 08/03/15  8:07 AM.  Always use your most recent med list.               AXIRON 30 MG/ACT Soln  Generic drug:  Testosterone  APPLY 1 PUMP UNDER EACH ARM     buPROPion 300 MG 24 hr tablet  Commonly known as:  WELLBUTRIN XL  TAKE 1 TABLET (300 MG TOTAL) BY MOUTH DAILY.     ciclopirox 8 % solution  Commonly known as:  PENLAC  APPLY TO NAIL &SURROUND SKIN AT BED OVER PREVIOUS COAT.AFTER 7 DAYS REMOVE W/ALCOHOL.REPEAT CYCLE     ibuprofen 200 MG tablet  Commonly known as:  ADVIL,MOTRIN  Take 200 mg by mouth every 6 (six) hours as needed for pain.        Allergies: No Known Allergies  Past Medical History  Diagnosis Date  .  Fainting spell     No past surgical history on file.  Family History  Problem Relation Age of Onset  . Miscarriages / [place] Mother   . Cancer Mother   . Depression Father   . Diabetes Father   . Drug abuse Brother   . Hearing loss Brother   . Learning disabilities Brother   . Stroke Brother   . Cancer Maternal Grandfather   . Arthritis Paternal Grandmother   . Hypertension Neg Hx     Social History:  reports that he has never smoked. He has never used smokeless tobacco. He reports that he drinks alcohol. He reports that he does not use illicit drugs.  ROS  Today he is asking about erectile dysfunction.  He says he has difficulty sustaining erections and has not discussed with PCP  He continues to take  Wellbutrin for depression  Has not been treated for sleep apnea as this apparently is present only when he is lying on his back, he sleeps on his side.  No recent sleepiness  OBESITY: Exercise: Exercising 3 times a week.  However not clear why he has gained about 12 pounds since his last visit   Wt Readings from Last 3 Encounters:  08/03/15 278 lb 6.4 oz (126.281 kg)  05/18/15 277 lb 8 oz (125.873 kg)  05/10/15 276 lb 4.8 oz (125.329 kg)    General Examination:   BP 130/84 mmHg  Pulse 72  Temp(Src) 98.4 F (36.9 C)  Resp 14  Ht 6\' 2"  (1.88 m)  Wt 278 lb 6.4 oz (126.281 kg)  BMI 35.73 kg/m2  SpO2 97%   Assessment/ Plan:   Hypogonadism,  hypogonadotropic and long-standing  He has been symptomatic from this and had subjectively done better with testosterone supplementation His levels have been fairly good with Axiron although not clear why the level was nearly 700 earlier this year He may be requiring slightly with larger doses of supplements with his weight gain  Since his level is about 400 and he subjectively doing well he can continue the same dose of 1 pump under each arm Co-pay card given for Axiron  ERECTILE dysfunction: Unclear etiology, no history of vascular disease or smoking He will empirically try Cialis 10-20 mg as needed, discussed how this works  He will come back for testosterone level and office visit in 6 months    [name] 08/03/2015, 8:07 AM

## 2015-08-03 NOTE — Patient Instructions (Signed)
Same dose 

## 2015-08-17 ENCOUNTER — Telehealth: Payer: Self-pay | Admitting: Endocrinology

## 2015-08-17 ENCOUNTER — Other Ambulatory Visit: Payer: Self-pay | Admitting: *Deleted

## 2015-08-17 MED ORDER — TADALAFIL 20 MG PO TABS: 20.0000 mg | ORAL_TABLET | Freq: Every day | ORAL | Status: DC | PRN

## 2015-08-17 NOTE — Telephone Encounter (Signed)
Patient would like a refill of his tadalafil (CIALIS) 20 MG tablet send to  [id number]- [place], [place] [address] [facility] [address] [phone] (Phone) [phone] (Fax)

## 2015-08-17 NOTE — Telephone Encounter (Signed)
Rx sent 

## 2015-08-21 NOTE — Progress Notes (Signed)
This encounter was created in error - please disregard.

## 2015-11-03 ENCOUNTER — Encounter: Payer: Self-pay | Admitting: Adult Health

## 2015-11-03 ENCOUNTER — Ambulatory Visit (INDEPENDENT_AMBULATORY_CARE_PROVIDER_SITE_OTHER): Payer: Managed Care, Other (non HMO) | Admitting: Adult Health

## 2015-11-03 VITALS — BP 110/72 | HR 85 | Temp 98.6°F | Ht 74.0 in | Wt 282.2 lb

## 2015-11-03 DIAGNOSIS — J0140 Acute pansinusitis, unspecified: Secondary | ICD-10-CM | POA: Diagnosis not present

## 2015-11-03 DIAGNOSIS — H65191 Other acute nonsuppurative otitis media, right ear: Secondary | ICD-10-CM | POA: Diagnosis not present

## 2015-11-03 MED ORDER — AMOXICILLIN-POT CLAVULANATE 875-125 MG PO TABS: 1.0000 | ORAL_TABLET | Freq: Two times a day (BID) | ORAL | Status: DC

## 2015-11-03 NOTE — Patient Instructions (Addendum)
It was great meeting you today!  I am sorry you are feeling so bad.   I have sent in a prescription for Augmentin. This will cover your ear infection and sinus infection.   Also use Flonase, normal saline nasal spray and Sudafed as needed

## 2015-11-03 NOTE — Progress Notes (Signed)
Subjective:    Patient ID: Joe Francis, male    DOB: 05-11-1971, 45 y.o.   MRN: [id number]  HPI  45 year old male, who presents to the clinic today for cough, chest and nasal congestion, feeling of clogged ears, sinus pain and pressure  for the last 5 days.   He has been using Mucinex which he did not notice that it was helping.   Subjective fever and chills.   Review of Systems  Constitutional: Positive for fever, chills, activity change and fatigue.  HENT: Positive for congestion, ear pain, postnasal drip, rhinorrhea, sinus pressure and sore throat. Negative for ear discharge.   Respiratory: Positive for cough. Negative for shortness of breath and wheezing.   Cardiovascular: Negative.   Musculoskeletal: Negative.   Neurological: Positive for headaches. Negative for dizziness.  Hematological: Negative.   Psychiatric/Behavioral: Negative.   All other systems reviewed and are negative.  Past Medical History  Diagnosis Date  . Fainting spell     Social History   Social History  . Marital Status: Married    Spouse Name: N/A  . Number of Children: N/A  . 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 Smoking status: Never Smoker   . Smokeless tobacco: Never Used  . Alcohol Use: Yes     Comment: Rarely  . Drug Use: No  . Sexual Activity: Not on file   Other Topics Concern  . Not on file   Social History Narrative   5-6 hours of sleep per night   3 people living in the residence    No past surgical history on file.  Family History  Problem Relation Age of Onset  . Miscarriages / [place] Mother   . Cancer Mother   . Depression Father   . Diabetes Father   . Drug abuse Brother   . Hearing loss Brother   . Learning disabilities Brother   . Stroke Brother   . Cancer Maternal Grandfather   . Arthritis Paternal Grandmother   . Hypertension Neg Hx     No Known Allergies  Current Outpatient  Prescriptions on File Prior to Visit  Medication Sig Dispense Refill  . AXIRON 30 MG/ACT SOLN APPLY 1 PUMP UNDER EACH ARM 90 mL 2  . buPROPion (WELLBUTRIN XL) 300 MG 24 hr tablet TAKE 1 TABLET (300 MG TOTAL) BY MOUTH DAILY. 90 tablet 1  . ibuprofen (ADVIL,MOTRIN) 200 MG tablet Take 200 mg by mouth every 6 (six) hours as needed for pain.    . tadalafil (CIALIS) 20 MG tablet Take 1 tablet (20 mg total) by mouth daily as needed for erectile dysfunction. 10 tablet 3   No current facility-administered medications on file prior to visit.    BP 110/72 mmHg  Pulse 85  Temp(Src) 98.6 F (37 C) (Oral)  Ht  (1.88 m)  Wt 282 lb 3.2 oz (128.005 kg)  BMI 36.22 kg/m2  SpO2 97%       Objective:   Physical Exam  Constitutional: He is oriented to person, place, and time. He appears well-developed and well-nourished. No distress.  HENT:  Head: Normocephalic and atraumatic.  Right Ear: There is tenderness. Tympanic membrane is erythematous and bulging. No middle ear effusion. Decreased hearing is noted.  Left Ear: There is tenderness. Tympanic membrane is bulging. Tympanic membrane is not erythematous.  No middle ear effusion. Decreased hearing is noted.  Nose: Right sinus exhibits  maxillary sinus tenderness and frontal sinus tenderness. Left sinus exhibits maxillary sinus tenderness and frontal sinus tenderness.  Mouth/Throat: Oropharynx is clear and moist. No oropharyngeal exudate.  Eyes: Conjunctivae are normal. Pupils are equal, round, and reactive to light. Right eye exhibits no discharge. Left eye exhibits no discharge. No scleral icterus.  Neck: Normal range of motion. Neck supple. No thyromegaly present.  Cardiovascular: Normal rate, regular rhythm, normal heart sounds and intact distal pulses.  Exam reveals no gallop.   No murmur heard. Pulmonary/Chest: Effort normal and breath sounds normal. No respiratory distress. He has no wheezes. He has no rales. He exhibits no tenderness.    Musculoskeletal: Normal range of motion. He exhibits no edema or tenderness.  Lymphadenopathy:    He has cervical adenopathy.       Right cervical: Superficial cervical and posterior cervical adenopathy present.       Left cervical: Superficial cervical and posterior cervical adenopathy present.  Neurological: He is alert and oriented to person, place, and time.  Skin: Skin is warm and dry. No rash noted. He is not diaphoretic. No erythema. No pallor.  Psychiatric: He has a normal mood and affect. His behavior is normal. Thought content normal.  Nursing note and vitals reviewed.     Assessment & Plan:  1. Acute nonsuppurative otitis media of right ear - amoxicillin-clavulanate (AUGMENTIN) 875-125 MG tablet; Take 1 tablet by mouth 2 (two) times daily.  Dispense: 20 tablet; Refill: 0  2. Acute pansinusitis, recurrence not specified - amoxicillin-clavulanate (AUGMENTIN) 875-125 MG tablet; Take 1 tablet by mouth 2 (two) times daily.  Dispense: 20 tablet; Refill: 0 - Flonase - Normal Saline nasal spray - Sudafed

## 2015-11-03 NOTE — Progress Notes (Signed)
Pre visit review using our clinic review tool, if applicable. No additional management support is needed unless otherwise documented below in the visit note. 

## 2015-11-26 ENCOUNTER — Other Ambulatory Visit: Payer: Self-pay | Admitting: Endocrinology

## 2016-01-29 ENCOUNTER — Other Ambulatory Visit: Payer: Self-pay | Admitting: *Deleted

## 2016-01-29 ENCOUNTER — Other Ambulatory Visit (INDEPENDENT_AMBULATORY_CARE_PROVIDER_SITE_OTHER): Payer: Managed Care, Other (non HMO)

## 2016-01-29 DIAGNOSIS — E291 Testicular hypofunction: Secondary | ICD-10-CM | POA: Diagnosis not present

## 2016-01-29 LAB — TESTOSTERONE: Testosterone: 187.51 ng/dL — ABNORMAL LOW (ref 300.00–890.00)

## 2016-02-01 ENCOUNTER — Other Ambulatory Visit: Payer: Self-pay

## 2016-02-01 ENCOUNTER — Encounter: Payer: Self-pay | Admitting: Endocrinology

## 2016-02-01 ENCOUNTER — Ambulatory Visit (INDEPENDENT_AMBULATORY_CARE_PROVIDER_SITE_OTHER): Payer: Managed Care, Other (non HMO) | Admitting: Endocrinology

## 2016-02-01 VITALS — BP 134/86 | HR 91 | Ht 74.0 in | Wt 280.0 lb

## 2016-02-01 DIAGNOSIS — E291 Testicular hypofunction: Secondary | ICD-10-CM

## 2016-02-01 MED ORDER — TADALAFIL 20 MG PO TABS: 20.0000 mg | ORAL_TABLET | Freq: Every day | ORAL | Status: DC | PRN

## 2016-02-01 MED ORDER — TESTOSTERONE 30 MG/ACT TD SOLN: TRANSDERMAL | Status: DC

## 2016-02-01 NOTE — Progress Notes (Signed)
Patient ID: Joe Francis, male   DOB: Aug 28, 1970, 45 y.o.   MRN: [id number]           Chief complaint: Follow-up of hypogonadism  History of Present Illness:  Hypogonadismwas initially diagnosed in 2013  He had at diagnosis  complaints of fatigue, lack of energy, decreased libido, decreased motivation. He had a low testosterone level in 2013 and was not evaluated with further labs He was initially tried on AndroGel but because of lack of increase in testosterone level he was started on testosterone injections.  He thinks that overall his symptoms improved with the injections His injection regimen was adjusted based on his testosterone level and he was taking regimens ranging from 200 mg every 2 weeks to 100 mg weekly. However with even a weekly injection he would have improved libido and energy only for 4 or 5 days With increasing his dosage his level went up to 892 and his dose was reduced somewhat. However with the injections his testosterone levels had fluctuated between 97 up to 1000 His testosterone level with urologist was 202 and he was evaluated further with LH, FSH, prolactin and thyroid functions which were normal. He was tried on clomiphene, initially 25 mg daily which did not help him subjectively even though his testosterone level had improved to 251 along with a free testosterone level of 69 compared to 57 at baseline Since he had inadequate  testosterone levels with Clomid and the need for long-term supplementation he was switched to AndroGel in 4/15.  Recent history: Baselinetestosterone level was 199 but has been as low as 69 at the lowest point  He was switched to Axiron in 1/16 because of insurance preference He is able to use this consistently using the proper technique He says he is very consistent with using it every morning  Currently using 1 pump under each arm daily  He is now complaining about increasing fatigue over the last 1-2 months, usually also with  low levels he has some decreased libido and motivation level Even though his dosage has not been changed his testosterone level is much  lower at 187    Lab Results  Component Value Date   TESTOSTERONE 187.51* 01/29/2016   TESTOSTERONE 404.15 07/27/2015   TESTOSTERONE 685.79 02/02/2015   TESTOSTERONE 401.47 11/14/2014          Medication List       This list is accurate as of: 02/01/16  8:45 AM.  Always use your most recent med list.               AXIRON 30 MG/ACT Soln  Generic drug:  Testosterone  APPLY 1 PUMP UNDER EACH ARM     buPROPion 300 MG 24 hr tablet  Commonly known as:  WELLBUTRIN XL  TAKE 1 TABLET (300 MG TOTAL) BY MOUTH DAILY.     ibuprofen 200 MG tablet  Commonly known as:  ADVIL,MOTRIN  Take 200 mg by mouth every 6 (six) hours as needed for pain.     tadalafil 20 MG tablet  Commonly known as:  CIALIS  Take 1 tablet (20 mg total) by mouth daily as needed for erectile dysfunction.        Allergies: No Known Allergies  Past Medical History  Diagnosis Date  . Fainting spell     No past surgical history on file.  Family History  Problem Relation Age of Onset  . Miscarriages / [place] Mother   . Cancer Mother   . Depression  Father   . Diabetes Father   . Drug abuse Brother   . Hearing loss Brother   . Learning disabilities Brother   . Stroke Brother   . Cancer Maternal Grandfather   . Arthritis Paternal Grandmother   . Hypertension Neg Hx     Social History:  reports that he has never smoked. He has never used smokeless tobacco. He reports that he drinks alcohol. He reports that he does not use illicit drugs.  ROS  He had relief of his erectile dysfunction with using Cialis but he finds it too expensive, no other drugs in the category are covered  He continues to take Wellbutrin for depression  Has not been treated for sleep apnea as this apparently is present only when he is lying on his back, he sleeps on his side.     Wt Readings from Last 3 Encounters:  02/01/16 280 lb (127.007 kg)  11/03/15 282 lb 3.2 oz (128.005 kg)  08/03/15 278 lb 6.4 oz (126.281 kg)    General Examination:   BP 134/86 mmHg  Pulse 91  Ht  (1.88 m)  Wt 280 lb (127.007 kg)  BMI 35.93 kg/m2  SpO2 95%   Assessment/ Plan:   Hypogonadism,  hypogonadotropic and long-standing  He has been symptomatic from this and had subjectively done better with testosterone supplementation His levels have been fairly good with Axiron although not clear why the level Is down below 200 again even without any change in his medication noncompliance He is using the correct application technique as before  Since the level is significantly low will go up to 2 pumps on each arm for now He will have labs checked again in 6 weeks  ERECTILE dysfunction: He can try taking a half tablet of Cialis, 20 MG prescription sent    [facility] 02/01/2016, 8:45 AM

## 2016-02-01 NOTE — Patient Instructions (Signed)
Axiron 2 pumps under each arm

## 2016-02-08 ENCOUNTER — Ambulatory Visit (INDEPENDENT_AMBULATORY_CARE_PROVIDER_SITE_OTHER): Payer: Managed Care, Other (non HMO) | Admitting: Family Medicine

## 2016-02-08 ENCOUNTER — Encounter: Payer: Self-pay | Admitting: Family Medicine

## 2016-02-08 VITALS — BP 112/80 | HR 89 | Temp 98.7°F | Ht 74.0 in | Wt 285.0 lb

## 2016-02-08 DIAGNOSIS — M546 Pain in thoracic spine: Secondary | ICD-10-CM

## 2016-02-08 DIAGNOSIS — M62838 Other muscle spasm: Secondary | ICD-10-CM | POA: Diagnosis not present

## 2016-02-08 DIAGNOSIS — M549 Dorsalgia, unspecified: Secondary | ICD-10-CM

## 2016-02-08 MED ORDER — CYCLOBENZAPRINE HCL 10 MG PO TABS: 10.0000 mg | ORAL_TABLET | Freq: Three times a day (TID) | ORAL | Status: DC | PRN
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
## 2016-02-08 NOTE — Patient Instructions (Addendum)
BEFORE YOU LEAVE: -upper back exercises -schedule follow/new patient visit with me or another provider in about 4 weeks  Do the exercises at least 4 days per week.  Heat, ibuprofen per instruction as needed, topical sports creams with capsaicin or menthol as needed for pain.  Muscle relaxer at night as needed.

## 2016-02-08 NOTE — Progress Notes (Signed)
Pre visit review using our clinic review tool, if applicable. No additional management support is needed unless otherwise documented below in the visit note. 

## 2016-02-09 ENCOUNTER — Other Ambulatory Visit: Payer: Self-pay

## 2016-02-09 ENCOUNTER — Telehealth: Payer: Self-pay | Admitting: Endocrinology

## 2016-02-09 ENCOUNTER — Other Ambulatory Visit: Payer: Self-pay | Admitting: Endocrinology

## 2016-02-09 MED ORDER — TESTOSTERONE 30 MG/ACT TD SOLN: TRANSDERMAL | Status: DC

## 2016-02-09 NOTE — Telephone Encounter (Signed)
PT called and said that he needs his Axiron called in for 90 days supply, it is much more cost efficient for him that way.  [facility] [address]

## 2016-02-09 NOTE — Telephone Encounter (Signed)
Rx printed, waiting on MD to sign.  

## 2016-02-19 ENCOUNTER — Other Ambulatory Visit: Payer: Self-pay | Admitting: Family Medicine

## 2016-02-19 NOTE — Telephone Encounter (Signed)
Refill request for bupropion XL 300 mg and a [facility] supply [facility]

## 2016-02-19 NOTE — Telephone Encounter (Signed)
I sent script e-scribe. 

## 2016-02-19 NOTE — Telephone Encounter (Signed)
Per Dr. Selena BattenKim, pt has appointment to establish care and we can refill script for # 90.

## 2016-02-20 ENCOUNTER — Other Ambulatory Visit: Payer: Self-pay | Admitting: Family Medicine

## 2016-02-20 MED ORDER — BUPROPION HCL ER (XL) 300 MG PO TB24: ORAL_TABLET | ORAL | Status: DC

## 2016-02-26 ENCOUNTER — Telehealth: Payer: Self-pay | Admitting: Endocrinology

## 2016-02-26 MED ORDER — TESTOSTERONE 30 MG/ACT TD SOLN: TRANSDERMAL | Status: DC

## 2016-02-26 NOTE — Telephone Encounter (Signed)
Yes, 2 pumps on each arm

## 2016-02-26 NOTE — Telephone Encounter (Signed)
Patient need a refill a 90 day supply of medication Testosterone (AXIRON) 30 MG/ACT SOLN,  [id number], [place] [facility] [address] [phone] (Phone) [phone] (Fax)

## 2016-02-26 NOTE — Telephone Encounter (Signed)
Rx submitted per pt's request.  

## 2016-02-26 NOTE — Telephone Encounter (Signed)
See below, ok to refill?  

## 2016-02-29 NOTE — Telephone Encounter (Signed)
Rx resubmitted with DAW 1 to the pt's pharmacy.

## 2016-02-29 NOTE — Telephone Encounter (Signed)
Pharmacy called, they need a new Rx submitted listed as DAW 1 in order for insurance to pay.

## 2016-03-05 ENCOUNTER — Other Ambulatory Visit: Payer: Self-pay

## 2016-03-05 MED ORDER — TESTOSTERONE 30 MG/ACT TD SOLN: TRANSDERMAL | Status: DC

## 2016-03-12 ENCOUNTER — Ambulatory Visit (INDEPENDENT_AMBULATORY_CARE_PROVIDER_SITE_OTHER): Payer: Managed Care, Other (non HMO) | Admitting: Family Medicine

## 2016-03-12 ENCOUNTER — Encounter: Payer: Self-pay | Admitting: Family Medicine

## 2016-03-12 VITALS — BP 120/82 | HR 82 | Temp 98.3°F | Ht 74.0 in | Wt 285.5 lb

## 2016-03-12 DIAGNOSIS — F329 Major depressive disorder, single episode, unspecified: Secondary | ICD-10-CM | POA: Diagnosis not present

## 2016-03-12 DIAGNOSIS — F32A Depression, unspecified: Secondary | ICD-10-CM

## 2016-03-12 DIAGNOSIS — E291 Testicular hypofunction: Secondary | ICD-10-CM | POA: Diagnosis not present

## 2016-03-12 DIAGNOSIS — G4733 Obstructive sleep apnea (adult) (pediatric): Secondary | ICD-10-CM | POA: Diagnosis not present

## 2016-03-12 DIAGNOSIS — Z7689 Persons encountering health services in other specified circumstances: Secondary | ICD-10-CM

## 2016-03-12 DIAGNOSIS — Z7189 Other specified counseling: Secondary | ICD-10-CM

## 2016-03-12 DIAGNOSIS — E669 Obesity, unspecified: Secondary | ICD-10-CM

## 2016-03-12 NOTE — Progress Notes (Signed)
Pre visit review using our clinic review tool, if applicable. No additional management support is needed unless otherwise documented below in the visit note. 
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
## 2016-03-12 NOTE — Patient Instructions (Signed)
BEFORE YOU LEAVE: -follow up: physical exam in about 3-4 months - come fasting IF you can, no problem if not  We recommend the following healthy lifestyle: 1) Small portions - eat off of salad plate instead of dinner plate 2) Eat a healthy clean diet with avoidance of (less then 1 serving per week) processed foods, sweetened drinks, white starches, red meat, fast foods and sweets and consisting of: * 5-9 servings per day of fresh or frozen fruits and vegetables (not corn or potatoes, not dried or canned) *nuts and seeds, beans *olives and olive oil *small portions of lean meats such as fish and white chicken  *small portions of whole grains 3)Get at least 150 minutes of sweaty aerobic exercise per week 4)reduce stress - counseling, meditation, relaxation to balance other aspects of your life

## 2016-03-14 ENCOUNTER — Other Ambulatory Visit: Payer: Managed Care, Other (non HMO)

## 2016-03-14 ENCOUNTER — Telehealth: Payer: Self-pay | Admitting: Endocrinology

## 2016-03-14 DIAGNOSIS — E291 Testicular hypofunction: Secondary | ICD-10-CM

## 2016-03-14 NOTE — Telephone Encounter (Signed)
Patient stated the medication Testosterone (AXIRON) 30 MG/ACT SOLN is working well. FYI

## 2016-03-15 LAB — TESTOSTERONE, FREE, TOTAL, SHBG
SEX HORMONE BINDING: 17 nmol/L (ref 16.5–55.9)
TESTOSTERONE FREE: 16.2 pg/mL (ref 6.8–21.5)
TESTOSTERONE: 386 ng/dL (ref 264–916)

## 2016-05-30 ENCOUNTER — Other Ambulatory Visit: Payer: Managed Care, Other (non HMO)

## 2016-06-04 ENCOUNTER — Ambulatory Visit: Payer: Managed Care, Other (non HMO) | Admitting: Endocrinology

## 2016-06-04 DIAGNOSIS — Z0289 Encounter for other administrative examinations: Secondary | ICD-10-CM
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
## 2016-06-27 ENCOUNTER — Telehealth: Payer: Self-pay | Admitting: *Deleted

## 2016-06-27 ENCOUNTER — Encounter: Payer: Managed Care, Other (non HMO) | Admitting: Family Medicine

## 2016-06-27 DIAGNOSIS — Z0289 Encounter for other administrative examinations: Secondary | ICD-10-CM

## 2016-06-27 NOTE — Telephone Encounter (Signed)
Patient was a no-show for today's visit.  Unable to leave a message at the pts home number due to no voicemail being set up.

## 2016-07-23 ENCOUNTER — Other Ambulatory Visit: Payer: Self-pay | Admitting: Endocrinology

## 2016-08-29 ENCOUNTER — Other Ambulatory Visit: Payer: Self-pay | Admitting: *Deleted

## 2016-08-30 ENCOUNTER — Other Ambulatory Visit: Payer: Self-pay | Admitting: Endocrinology

## 2016-08-30 ENCOUNTER — Telehealth: Payer: Self-pay | Admitting: Family Medicine

## 2016-08-30 NOTE — Telephone Encounter (Signed)
Needs physical appt 

## 2016-08-30 NOTE — Telephone Encounter (Signed)
Unable to leave a message due to no voicemail being set up yet. 

## 2016-08-30 NOTE — Telephone Encounter (Signed)
Pt need new Rx for bupropion   [facility]

## 2016-09-02 MED ORDER — BUPROPION HCL ER (XL) 300 MG PO TB24: ORAL_TABLET | ORAL | 1 refills | Status: DC

## 2016-09-02 NOTE — Telephone Encounter (Signed)
Unable to leave a message due to no voicemail being set up. °

## 2016-09-03 NOTE — Telephone Encounter (Signed)
Patient has not been see since 02/01/16 and does not have a future appointment ok to refill please advise

## 2016-09-04 ENCOUNTER — Telehealth: Payer: Self-pay | Admitting: Endocrinology

## 2016-09-04 NOTE — Telephone Encounter (Signed)
[facility] of [facility] Axiron refill request.

## 2016-09-05 MED ORDER — TESTOSTERONE 30 MG/ACT TD SOLN: TRANSDERMAL | 1 refills | Status: DC

## 2016-09-05 NOTE — Telephone Encounter (Signed)
Refill submitted. 

## 2016-09-05 NOTE — Telephone Encounter (Signed)
Rx printed and given to MD to sign.  

## 2016-09-30 ENCOUNTER — Other Ambulatory Visit: Payer: Self-pay

## 2016-09-30 MED ORDER — TESTOSTERONE 30 MG/ACT TD SOLN: TRANSDERMAL | 1 refills | Status: DC
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
## 2016-10-03 ENCOUNTER — Ambulatory Visit (INDEPENDENT_AMBULATORY_CARE_PROVIDER_SITE_OTHER): Payer: Managed Care, Other (non HMO) | Admitting: Family Medicine

## 2016-10-03 ENCOUNTER — Encounter: Payer: Self-pay | Admitting: Family Medicine

## 2016-10-03 VITALS — BP 110/80 | HR 84 | Temp 98.4°F | Ht 74.0 in | Wt 288.8 lb

## 2016-10-03 DIAGNOSIS — Z6836 Body mass index (BMI) 36.0-36.9, adult: Secondary | ICD-10-CM | POA: Insufficient documentation

## 2016-10-03 DIAGNOSIS — Z6837 Body mass index (BMI) 37.0-37.9, adult: Secondary | ICD-10-CM | POA: Diagnosis not present

## 2016-10-03 DIAGNOSIS — G4733 Obstructive sleep apnea (adult) (pediatric): Secondary | ICD-10-CM

## 2016-10-03 DIAGNOSIS — Z0000 Encounter for general adult medical examination without abnormal findings: Secondary | ICD-10-CM | POA: Diagnosis not present

## 2016-10-03 DIAGNOSIS — R5383 Other fatigue: Secondary | ICD-10-CM | POA: Diagnosis not present

## 2016-10-03 DIAGNOSIS — L989 Disorder of the skin and subcutaneous tissue, unspecified: Secondary | ICD-10-CM

## 2016-10-03 DIAGNOSIS — L608 Other nail disorders: Secondary | ICD-10-CM

## 2016-10-03 DIAGNOSIS — F3341 Major depressive disorder, recurrent, in partial remission: Secondary | ICD-10-CM | POA: Diagnosis not present

## 2016-10-03 LAB — CBC
HCT: 48 % (ref 39.0–52.0)
Hemoglobin: 16 g/dL (ref 13.0–17.0)
MCHC: 33.2 g/dL (ref 30.0–36.0)
MCV: 85.2 fl (ref 78.0–100.0)
PLATELETS: 258 10*3/uL (ref 150.0–400.0)
RBC: 5.63 Mil/uL (ref 4.22–5.81)
RDW: 15.5 % (ref 11.5–15.5)
WBC: 6 10*3/uL (ref 4.0–10.5)

## 2016-10-03 LAB — LIPID PANEL
Cholesterol: 197 mg/dL (ref 0–200)
HDL: 36.3 mg/dL — ABNORMAL LOW (ref >39.00)
LDL Cholesterol: 131 mg/dL — ABNORMAL HIGH (ref 0–99)
NONHDL: 161.15
TRIGLYCERIDES: 152 mg/dL — AB (ref 0.0–149.0)
Total CHOL/HDL Ratio: 5
VLDL: 30.4 mg/dL (ref 0.0–40.0)

## 2016-10-03 LAB — HEMOGLOBIN A1C: HEMOGLOBIN A1C: 5.7 % (ref 4.6–6.5)

## 2016-10-03 LAB — TSH: TSH: 2.08 u[IU]/mL (ref 0.35–4.50)

## 2016-10-03 NOTE — Patient Instructions (Signed)
BEFORE YOU LEAVE: -follow up: in 3 months -labs  When you notice feeling down - get CBT (cognitive behavioral therapy), get outside, schedule fun activities. Seek care immediately if worsening.  For the toenail try the topical antifungals, keep feet clean and dry, apple cider vinagar.  If you keep feeling tired despite changing diet would advise retesting for sleep apnea.  We have ordered labs or studies at this visit. It can take up to 1-2 weeks for results and processing. IF results require follow up or explanation, we will call you with instructions. Clinically stable results will be released to your [facility]. If you have not heard from [place] or cannot find your results in [facility] in 2 weeks please contact our office at [phone].   If you are not yet signed up for [facility], please SIGN UP TODAY. We now offer online scheduling, same day appointments and extended hours. WHEN YOU DON'T FEEL YOUR BEST.Marland Kitchen.Marland Kitchen.WE ARE HERE TO HELP.   We recommend the following healthy lifestyle for LIFE: 1) Small portions.   Tip: eat off of a salad plate instead of a dinner plate.  Tip: It is ok to feel hungry after a meal of proper portion sizes.  Tip: if you need more or a snack choose fruits, veggies and/or a handful of nuts or seeds.  2) Eat a healthy clean diet.  * Tip: Avoid (less then 1 serving per week): processed foods, sweets, sweetened drinks, white starches (rice, flour, bread, potatoes, pasta, etc), red meat, fast foods, butter  *Tip: CHOOSE instead   * 5-9 servings per day of fresh or frozen fruits and vegetables (but not corn, potatoes, bananas, canned or dried fruit)   *nuts and seeds, beans   *olives and olive oil   *small portions of lean meats such as fish and white chicken    *small portions of whole grains  3)Get at least 150 minutes of sweaty aerobic exercise per week.  4)Reduce stress - consider counseling, meditation and relaxation to balance other aspects of your  life.

## 2016-10-03 NOTE — Progress Notes (Signed)
Pre visit review using our clinic review tool, if applicable. No additional management support is needed unless otherwise documented below in the visit note. 

## 2016-10-07 ENCOUNTER — Other Ambulatory Visit: Payer: Self-pay

## 2016-10-07 MED ORDER — TESTOSTERONE 30 MG/ACT TD SOLN: TRANSDERMAL | 1 refills | Status: DC

## 2016-10-08 ENCOUNTER — Other Ambulatory Visit: Payer: Self-pay

## 2016-10-10 ENCOUNTER — Encounter: Payer: Self-pay | Admitting: *Deleted

## 2016-11-25 ENCOUNTER — Telehealth: Payer: Self-pay | Admitting: Endocrinology

## 2016-11-25 ENCOUNTER — Encounter: Payer: Self-pay | Admitting: Endocrinology

## 2016-11-25 NOTE — Telephone Encounter (Signed)
-----   Message from Reather Littler, MD sent at 11/24/2016 11:30 AM EDT ----- Needs appt with labs

## 2016-11-25 NOTE — Telephone Encounter (Signed)
Attempted to call pt, no answer no vm set up  Mailed lettter

## 2016-11-28 ENCOUNTER — Telehealth: Payer: Self-pay | Admitting: Endocrinology

## 2016-11-28 MED ORDER — TESTOSTERONE 30 MG/ACT TD SOLN: TRANSDERMAL | 1 refills | Status: DC

## 2016-11-28 NOTE — Telephone Encounter (Signed)
Refill of  Testosterone 30 MG/ACT SOLN 90 day supply  [id number] [place], [place] - [address] [phone] (Phone) [phone] (Fax)

## 2016-11-28 NOTE — Telephone Encounter (Signed)
Refill submitted. Per. Lucianne Muss he would like for the patient to come in for blood tests before his appointment 12/02/2016. I attempted to reach the patient, but patient was not available and did not have a working Engineer, technical sales. Please reattempt to reach the patient.

## 2016-11-29 NOTE — Telephone Encounter (Signed)
Attempted to reach the patient but he does not have a working vm set up will reattempt later

## 2016-12-02 ENCOUNTER — Encounter: Payer: Self-pay | Admitting: Endocrinology

## 2016-12-02 ENCOUNTER — Ambulatory Visit (INDEPENDENT_AMBULATORY_CARE_PROVIDER_SITE_OTHER): Payer: 59 | Admitting: Endocrinology

## 2016-12-02 VITALS — BP 128/88 | HR 87 | Ht 74.0 in | Wt 291.0 lb

## 2016-12-02 DIAGNOSIS — R5383 Other fatigue: Secondary | ICD-10-CM | POA: Diagnosis not present

## 2016-12-02 DIAGNOSIS — E291 Testicular hypofunction: Secondary | ICD-10-CM

## 2016-12-02 DIAGNOSIS — Z131 Encounter for screening for diabetes mellitus: Secondary | ICD-10-CM | POA: Diagnosis not present

## 2016-12-02 LAB — TESTOSTERONE: Testosterone: 215.8 ng/dL — ABNORMAL LOW (ref 300.00–890.00)

## 2016-12-02 LAB — GLUCOSE, RANDOM: GLUCOSE: 119 mg/dL — AB (ref 70–99)

## 2016-12-02 LAB — T4, FREE: FREE T4: 0.76 ng/dL (ref 0.60–1.60)
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
## 2017-01-02 ENCOUNTER — Other Ambulatory Visit: Payer: 59 | Admitting: Family Medicine

## 2017-02-28 ENCOUNTER — Ambulatory Visit (INDEPENDENT_AMBULATORY_CARE_PROVIDER_SITE_OTHER): Payer: 59 | Admitting: Family Medicine

## 2017-02-28 ENCOUNTER — Encounter: Payer: Self-pay | Admitting: Family Medicine

## 2017-02-28 VITALS — BP 108/76 | HR 87 | Temp 98.7°F | Ht 74.0 in | Wt 286.2 lb

## 2017-02-28 DIAGNOSIS — H547 Unspecified visual loss: Secondary | ICD-10-CM | POA: Diagnosis not present

## 2017-02-28 DIAGNOSIS — F3341 Major depressive disorder, recurrent, in partial remission: Secondary | ICD-10-CM

## 2017-02-28 DIAGNOSIS — E785 Hyperlipidemia, unspecified: Secondary | ICD-10-CM | POA: Insufficient documentation

## 2017-02-28 DIAGNOSIS — Z6836 Body mass index (BMI) 36.0-36.9, adult: Secondary | ICD-10-CM

## 2017-02-28 DIAGNOSIS — R41840 Attention and concentration deficit: Secondary | ICD-10-CM

## 2017-02-28 NOTE — Patient Instructions (Signed)
BEFORE YOU LEAVE: -follow up: 4 months - come fasting if possible - ok to have water and black coffee  Call for evaluation with Dr. Jason FilaBray regarding the focus issues.  Call to schedule eye exam.  We recommend the following healthy lifestyle for LIFE: 1) Small portions.   Tip: eat off of a salad plate instead of a dinner plate.  Tip: It is ok to feel hungry after a meal - that likely means you ate an appropriate portion.  Tip: if you need more or a snack choose fruits, veggies and/or a handful of nuts or seeds.  2) Eat a healthy clean diet.   TRY TO EAT: -at least 5-7 servings of low sugar vegetables per day (not corn, potatoes or bananas.) -berries are the best choice if you wish to eat fruit.   -lean meets (fish, chicken or [place]key breasts) -vegan proteins for some meals - beans or tofu, whole grains, nuts and seeds -Replace bad fats with good fats - good fats include: fish, nuts and seeds, canola oil, olive oil -small amounts of low fat or non fat dairy -small amounts of100 % whole grains - check the lables  AVOID: -SUGAR, sweets, anything with added sugar, corn syrup or sweeteners -if you must have a sweetener, small amounts of stevia may be best -sweetened beverages -simple starches (rice, bread, potatoes, pasta, chips, etc - small amounts of 100% whole grains are ok) -red meat, pork, butter -fried foods, fast food, processed food, excessive dairy, eggs and coconut.  3)Get at least 150 minutes of sweaty aerobic exercise per week.  4)Reduce stress - consider counseling, meditation and relaxation to balance other aspects of your life.
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
## 2017-05-15 ENCOUNTER — Encounter: Payer: Self-pay | Admitting: Family Medicine

## 2017-05-27 ENCOUNTER — Other Ambulatory Visit: Payer: Self-pay | Admitting: Endocrinology
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
## 2017-06-03 ENCOUNTER — Ambulatory Visit: Payer: 59 | Admitting: Endocrinology

## 2017-06-03 DIAGNOSIS — Z0289 Encounter for other administrative examinations: Secondary | ICD-10-CM

## 2017-09-04 ENCOUNTER — Encounter: Payer: Self-pay | Admitting: Family Medicine

## 2017-10-10 ENCOUNTER — Ambulatory Visit: Payer: 59 | Admitting: Family Medicine

## 2017-11-06 ENCOUNTER — Other Ambulatory Visit: Payer: Self-pay | Admitting: Endocrinology

## 2017-11-24 ENCOUNTER — Encounter: Payer: Self-pay | Admitting: Family Medicine

## 2017-11-24 ENCOUNTER — Ambulatory Visit (INDEPENDENT_AMBULATORY_CARE_PROVIDER_SITE_OTHER): Payer: 59 | Admitting: Family Medicine

## 2017-11-24 VITALS — BP 142/90 | HR 83 | Temp 98.5°F | Ht 74.0 in | Wt 288.2 lb

## 2017-11-24 DIAGNOSIS — R079 Chest pain, unspecified: Secondary | ICD-10-CM

## 2017-11-24 DIAGNOSIS — R0789 Other chest pain: Secondary | ICD-10-CM

## 2017-11-24 LAB — CBC WITH DIFFERENTIAL/PLATELET
BASOS PCT: 0.5 % (ref 0.0–3.0)
Basophils Absolute: 0 10*3/uL (ref 0.0–0.1)
EOS ABS: 0.1 10*3/uL (ref 0.0–0.7)
Eosinophils Relative: 2.2 % (ref 0.0–5.0)
HCT: 44.5 % (ref 39.0–52.0)
HEMOGLOBIN: 15.5 g/dL (ref 13.0–17.0)
Lymphocytes Relative: 22.9 % (ref 12.0–46.0)
Lymphs Abs: 1.3 10*3/uL (ref 0.7–4.0)
MCHC: 34.8 g/dL (ref 30.0–36.0)
MCV: 84.4 fl (ref 78.0–100.0)
MONO ABS: 0.4 10*3/uL (ref 0.1–1.0)
Monocytes Relative: 6.4 % (ref 3.0–12.0)
NEUTROS ABS: 4 10*3/uL (ref 1.4–7.7)
Neutrophils Relative %: 68 % (ref 43.0–77.0)
PLATELETS: 234 10*3/uL (ref 150.0–400.0)
RBC: 5.27 Mil/uL (ref 4.22–5.81)
RDW: 14.8 % (ref 11.5–15.5)
WBC: 5.9 10*3/uL (ref 4.0–10.5)

## 2017-11-24 LAB — BASIC METABOLIC PANEL
BUN: 10 mg/dL (ref 6–23)
CHLORIDE: 105 meq/L (ref 96–112)
CO2: 27 meq/L (ref 19–32)
Calcium: 9.1 mg/dL (ref 8.4–10.5)
Creatinine, Ser: 1.04 mg/dL (ref 0.40–1.50)
GFR: 80.78 mL/min (ref >60.00)
Glucose, Bld: 102 mg/dL — ABNORMAL HIGH (ref 70–99)
POTASSIUM: 4.1 meq/L (ref 3.5–5.1)
Sodium: 139 mEq/L (ref 135–145)

## 2017-11-24 MED ORDER — OMEPRAZOLE 20 MG PO CPDR: 20.0000 mg | DELAYED_RELEASE_CAPSULE | Freq: Every day | ORAL | 0 refills | Status: DC

## 2017-11-24 NOTE — Progress Notes (Signed)
   Subjective:    Patient ID: Joe Francis, male    DOB: 02-26-1971, 47 y.o.   MRN: [id number]  HPI Here for one month of episodes of chest pain or pressure. He has 2 or 3 of these a week. They always happen at work, usually around mid morning. He works as an Insurance underwriterinsurance adjuster and he says his job is stressful. He never eats breakfast but usually has 2 cups of coffee each morning. He describes a pressure like pain in the center of the chest that sometimes radiates up to the left jaw and sometimes down the left arm. No back pain. He gets mildly SOB with these episodes. Some nausea but no vomiting. No sweats. These spells last about one hour and then fade away. No hx of indigestion or coughing. No family hx of heart disease.    Review of Systems  Constitutional: Negative.   Respiratory: Positive for chest tightness and shortness of breath. Negative for cough and wheezing.   Cardiovascular: Positive for chest pain. Negative for palpitations and leg swelling.  Gastrointestinal: Positive for nausea. Negative for abdominal distention, abdominal pain, blood in stool, constipation, diarrhea and vomiting.  Neurological: Negative.        Objective:   Physical Exam  Constitutional: He is oriented to person, place, and time. He appears well-developed and well-nourished. No distress.  Neck: No thyromegaly present.  Cardiovascular: Normal rate, regular rhythm, normal heart sounds and intact distal pulses.  No murmur heard. EKG is normal   Pulmonary/Chest: Effort normal and breath sounds normal. No respiratory distress. He has no wheezes. He has no rales. He exhibits no tenderness.  Abdominal: Soft. Bowel sounds are normal. He exhibits no distension and no mass. There is no tenderness. There is no rebound and no guarding.  Lymphadenopathy:    He has no cervical adenopathy.  Neurological: He is alert and oriented to person, place, and time.          Assessment & Plan:  He is having episodes of  chest pain and SOB at rest, never with exertion. They only occur at work, which he describes a stressful. In some ways this sounds like it may have a cardiac etiology, but in other ways not. It could certainly be esophageal in nature. He will start taking Prilosec 20 mg every morning. Set up a perfusion stress test soon.  Gershon CraneStephen Lashanta Elbe, MD

## 2017-11-25 ENCOUNTER — Telehealth (HOSPITAL_COMMUNITY): Payer: Self-pay | Admitting: *Deleted

## 2017-11-25 NOTE — Telephone Encounter (Signed)
Attempted to leave a message on voicemail in reference to upcoming appointment scheduled but no answer and voicemail not set up for 12/01/17. Phone number given for a call back so details instructions can be given. Atisha Hamidi, Adelene IdlerCynthia W

## 2017-12-01 ENCOUNTER — Ambulatory Visit (HOSPITAL_COMMUNITY): Payer: 59 | Attending: Cardiovascular Disease

## 2017-12-01 DIAGNOSIS — I519 Heart disease, unspecified: Secondary | ICD-10-CM | POA: Insufficient documentation

## 2017-12-01 DIAGNOSIS — R0602 Shortness of breath: Secondary | ICD-10-CM | POA: Insufficient documentation

## 2017-12-01 DIAGNOSIS — R5383 Other fatigue: Secondary | ICD-10-CM | POA: Insufficient documentation

## 2017-12-01 DIAGNOSIS — R079 Chest pain, unspecified: Secondary | ICD-10-CM | POA: Diagnosis present

## 2017-12-01 MED ORDER — TECHNETIUM TC 99M TETROFOSMIN IV KIT
33.0000 | PACK | Freq: Once | INTRAVENOUS | Status: AC | PRN
  Administered 2017-12-01: 33 via INTRAVENOUS
  Filled 2017-12-01: qty 33

## 2017-12-02 ENCOUNTER — Ambulatory Visit (HOSPITAL_COMMUNITY): Payer: 59 | Attending: Cardiovascular Disease

## 2017-12-02 LAB — MYOCARDIAL PERFUSION IMAGING
CHL CUP MPHR: 172 {beats}/min
CHL CUP NUCLEAR SDS: 4
Estimated workload: 10.2 METS
Exercise duration (min): 8 min
Exercise duration (sec): 26 s
LHR: 0.42
LV dias vol: 113 mL (ref 62–150)
LVSYSVOL: 60 mL
Peak HR: 160 {beats}/min
Percent HR: 93 %
RPE: 18
Rest HR: 108 {beats}/min
SRS: 1
SSS: 5
TID: 0.88

## 2017-12-02 MED ORDER — TECHNETIUM TC 99M TETROFOSMIN IV KIT
32.1000 | PACK | Freq: Once | INTRAVENOUS | Status: AC | PRN
  Administered 2017-12-02: 32.1 via INTRAVENOUS
  Filled 2017-12-02: qty 33

## 2017-12-08 ENCOUNTER — Ambulatory Visit (INDEPENDENT_AMBULATORY_CARE_PROVIDER_SITE_OTHER): Payer: 59 | Admitting: Family Medicine

## 2017-12-08 ENCOUNTER — Ambulatory Visit (INDEPENDENT_AMBULATORY_CARE_PROVIDER_SITE_OTHER): Payer: 59 | Admitting: Cardiovascular Disease

## 2017-12-08 ENCOUNTER — Telehealth (INDEPENDENT_AMBULATORY_CARE_PROVIDER_SITE_OTHER): Payer: 59 | Admitting: *Deleted

## 2017-12-08 ENCOUNTER — Encounter: Payer: Self-pay | Admitting: Cardiovascular Disease

## 2017-12-08 VITALS — BP 138/90 | HR 83 | Resp 16

## 2017-12-08 VITALS — BP 110/88 | HR 84 | Ht 74.0 in | Wt 287.0 lb

## 2017-12-08 DIAGNOSIS — E7800 Pure hypercholesterolemia, unspecified: Secondary | ICD-10-CM

## 2017-12-08 DIAGNOSIS — R072 Precordial pain: Secondary | ICD-10-CM

## 2017-12-08 DIAGNOSIS — E6609 Other obesity due to excess calories: Secondary | ICD-10-CM | POA: Diagnosis not present

## 2017-12-08 DIAGNOSIS — R079 Chest pain, unspecified: Secondary | ICD-10-CM

## 2017-12-08 DIAGNOSIS — R931 Abnormal findings on diagnostic imaging of heart and coronary circulation: Secondary | ICD-10-CM

## 2017-12-08 DIAGNOSIS — Z6836 Body mass index (BMI) 36.0-36.9, adult: Secondary | ICD-10-CM | POA: Diagnosis not present

## 2017-12-08 NOTE — Patient Instructions (Signed)
   [facility] [address] [place] [address] Dept: [phone] Loc: [phone]  Winfred BurnDouglas S Werst  12/08/2017  You are scheduled for a Cardiac Catheterization on Tuesday, April 16 with Dr. [name].  1. [facility] at [facility]: [address] [place], [place] [address] at 5:30 AM (two hours before your procedure to ensure your preparation). Free valet parking service is available.   Special note: Every effort is made to have your procedure done on time. Please understand that emergencies sometimes delay scheduled procedures.  2. Diet: Do not eat or drink anything after midnight prior to your procedure except sips of water to take medications.  3. Labs: TODAY  4. Medication instructions in preparation for your procedure: take your medications as you typically would On the morning of your procedure, take Aspirin 325 mg.  You may use sips of water.  5. Plan for one night stay--bring personal belongings.  6. Bring a current list of your medications and current insurance cards.  7. You MUST have a responsible person to drive you home.  8. Someone MUST be with you the first 24 hours after you arrive home or your discharge will be delayed.  9. Please wear clothes that are easy to get on and off and wear slip-on shoes.  Thank you for allowing us to care for you!   -- [facility]

## 2017-12-08 NOTE — Telephone Encounter (Signed)
Patient walked into the office c/o chest pain that started last night, but he was still able to sleep.  He was seen in the office on 11/24/17 by Dr. Clent RidgesFry for similar complaints, but last night and today has associated nausea, dizziness, lightheadedness, and sweating. Because of these new symptoms, he decided to come into the office this morning. Rates pain as 5/10 and describes pain as "dull." Pain is located in the mid anterior chest and radiates up from his stomach. He has been taking Prilosec as prescribed since 11/24/17 with no change. He also completed a cardiac stress test, which was negative for acute blockage.  Patient is alert and in no distress. Does appear slightly pale and eyes are watery.  O: 97 P: 83 BP: 138/90 RR:16  Discussed w/ Dr. Selena BattenKim, we initially agreed on ED eval preferably via EMS and EKG in office while waiting. However, patient now asymptomatic and ultimately placed on Dr. Elmyra RicksKim's schedule and opted for cardiology referral now and ED if recurrence of symptoms.   [facility] and spoke with Nettie ElmSylvia and patient scheduled for today at 11:00 w/ Dr. Royann Shiversroitoru. Pt notified of results/instructions and verbalized understanding. Patient given written appointment information.
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
## 2017-12-09 ENCOUNTER — Ambulatory Visit (HOSPITAL_COMMUNITY)
Admission: RE | Admit: 2017-12-09 | Discharge: 2017-12-09 | Disposition: A | Discharge status: Home / Self Care | Payer: 59 | Source: Ambulatory Visit | Attending: Cardiology | Admitting: Cardiology

## 2017-12-09 ENCOUNTER — Ambulatory Visit (HOSPITAL_COMMUNITY)
Admission: RE | Disposition: A | Discharge status: Home / Self Care | Payer: Self-pay | Source: Ambulatory Visit | Attending: Cardiology

## 2017-12-09 ENCOUNTER — Encounter (HOSPITAL_COMMUNITY): Payer: Self-pay | Admitting: Cardiology

## 2017-12-09 ENCOUNTER — Ambulatory Visit: Payer: 59 | Admitting: Family Medicine

## 2017-12-09 DIAGNOSIS — R072 Precordial pain: Secondary | ICD-10-CM

## 2017-12-09 DIAGNOSIS — E785 Hyperlipidemia, unspecified: Secondary | ICD-10-CM | POA: Diagnosis present

## 2017-12-09 DIAGNOSIS — I2089 Other forms of angina pectoris: Secondary | ICD-10-CM | POA: Clinically undetermined

## 2017-12-09 DIAGNOSIS — I208 Other forms of angina pectoris: Secondary | ICD-10-CM | POA: Insufficient documentation

## 2017-12-09 DIAGNOSIS — R9439 Abnormal result of other cardiovascular function study: Secondary | ICD-10-CM | POA: Diagnosis not present

## 2017-12-09 HISTORY — PX: LEFT HEART CATH AND CORONARY ANGIOGRAPHY: CATH118249

## 2017-12-09 LAB — BASIC METABOLIC PANEL
BUN / CREAT RATIO: 10 (ref 9–20)
BUN: 10 mg/dL (ref 6–24)
CHLORIDE: 102 mmol/L (ref 96–106)
CO2: 22 mmol/L (ref 20–29)
CREATININE: 1.05 mg/dL (ref 0.76–1.27)
Calcium: 9.4 mg/dL (ref 8.7–10.2)
GFR calc Af Amer: 97 mL/min/{1.73_m2} (ref >59)
GFR calc non Af Amer: 84 mL/min/{1.73_m2} (ref >59)
GLUCOSE: 93 mg/dL (ref 65–99)
Potassium: 4.8 mmol/L (ref 3.5–5.2)
Sodium: 139 mmol/L (ref 134–144)

## 2017-12-09 LAB — PROTIME-INR
INR: 1 (ref 0.8–1.2)
INR: 0.99
PROTHROMBIN TIME: 10.3 s (ref 9.1–12.0)
Prothrombin Time: 13 seconds (ref 11.4–15.2)

## 2017-12-09 SURGERY — LEFT HEART CATH AND CORONARY ANGIOGRAPHY
Anesthesia: LOCAL

## 2017-12-09 MED ORDER — MIDAZOLAM HCL 2 MG/2ML IJ SOLN
INTRAMUSCULAR | Status: DC | PRN
  Administered 2017-12-09: 2 mg via INTRAVENOUS

## 2017-12-09 MED ORDER — ACETAMINOPHEN 325 MG PO TABS: 650.0000 mg | ORAL_TABLET | ORAL | Status: DC | PRN

## 2017-12-09 MED ORDER — MIDAZOLAM HCL 2 MG/2ML IJ SOLN
INTRAMUSCULAR | Status: AC
  Filled 2017-12-09: qty 2

## 2017-12-09 MED ORDER — LIDOCAINE HCL (PF) 1 % IJ SOLN
INTRAMUSCULAR | Status: DC | PRN
  Administered 2017-12-09: 3 mL

## 2017-12-09 MED ORDER — IOPAMIDOL (ISOVUE-370) INJECTION 76%
INTRAVENOUS | Status: DC | PRN
  Administered 2017-12-09: 65 mL via INTRAVENOUS

## 2017-12-09 MED ORDER — HEPARIN SODIUM (PORCINE) 1000 UNIT/ML IJ SOLN
INTRAMUSCULAR | Status: DC | PRN
  Administered 2017-12-09: 6000 [IU] via INTRAVENOUS

## 2017-12-09 MED ORDER — SODIUM CHLORIDE 0.9% FLUSH: 3.0000 mL | INTRAVENOUS | Status: DC | PRN

## 2017-12-09 MED ORDER — FENTANYL CITRATE (PF) 100 MCG/2ML IJ SOLN
INTRAMUSCULAR | Status: AC
  Filled 2017-12-09: qty 2

## 2017-12-09 MED ORDER — SODIUM CHLORIDE 0.9 % IV SOLN
INTRAVENOUS | Status: DC
  Administered 2017-12-09: 07:00:00 via INTRAVENOUS

## 2017-12-09 MED ORDER — HEPARIN SODIUM (PORCINE) 1000 UNIT/ML IJ SOLN
INTRAMUSCULAR | Status: AC
  Filled 2017-12-09: qty 1

## 2017-12-09 MED ORDER — HEPARIN (PORCINE) IN NACL 2-0.9 UNIT/ML-% IJ SOLN
INTRAMUSCULAR | Status: AC | PRN
  Administered 2017-12-09 (×2): 500 mL

## 2017-12-09 MED ORDER — LIDOCAINE HCL (PF) 1 % IJ SOLN
INTRAMUSCULAR | Status: AC
  Filled 2017-12-09: qty 30

## 2017-12-09 MED ORDER — HEPARIN (PORCINE) IN NACL 2-0.9 UNIT/ML-% IJ SOLN
INTRAMUSCULAR | Status: AC
  Filled 2017-12-09: qty 500

## 2017-12-09 MED ORDER — SODIUM CHLORIDE 0.9 % IV SOLN: 250.0000 mL | INTRAVENOUS | Status: DC | PRN

## 2017-12-09 MED ORDER — ONDANSETRON HCL 4 MG/2ML IJ SOLN: 4.0000 mg | Freq: Four times a day (QID) | INTRAMUSCULAR | Status: DC | PRN

## 2017-12-09 MED ORDER — ASPIRIN 81 MG PO CHEW
81.0000 mg | CHEWABLE_TABLET | ORAL | Status: AC
  Administered 2017-12-09: 81 mg via ORAL

## 2017-12-09 MED ORDER — VERAPAMIL HCL 2.5 MG/ML IV SOLN
INTRAVENOUS | Status: AC
  Filled 2017-12-09: qty 2

## 2017-12-09 MED ORDER — SODIUM CHLORIDE 0.9% FLUSH: 3.0000 mL | Freq: Two times a day (BID) | INTRAVENOUS | Status: DC

## 2017-12-09 MED ORDER — IOPAMIDOL (ISOVUE-370) INJECTION 76%
INTRAVENOUS | Status: AC
  Filled 2017-12-09: qty 100

## 2017-12-09 MED ORDER — FENTANYL CITRATE (PF) 100 MCG/2ML IJ SOLN
INTRAMUSCULAR | Status: DC | PRN
  Administered 2017-12-09: 50 ug via INTRAVENOUS

## 2017-12-09 MED ORDER — SODIUM CHLORIDE 0.9 % IV SOLN: INTRAVENOUS | Status: DC

## 2017-12-09 MED ORDER — HEPARIN (PORCINE) IN NACL 2-0.9 UNIT/ML-% IJ SOLN
INTRAMUSCULAR | Status: DC | PRN
  Administered 2017-12-09: 10 mL via INTRA_ARTERIAL

## 2017-12-09 MED ORDER — ASPIRIN 81 MG PO CHEW
CHEWABLE_TABLET | ORAL | Status: AC
  Filled 2017-12-09: qty 1

## 2017-12-09 SURGICAL SUPPLY — 9 items
BAND ZEPHYR COMPRESS 30 LONG (HEMOSTASIS) ×2 IMPLANT
CATH OPTITORQUE TIG 4.0 5F (CATHETERS) ×2 IMPLANT
GLIDESHEATH SLEND A-KIT 6F 22G (SHEATH) ×2 IMPLANT
GUIDEWIRE INQWIRE 1.5J.035X260 (WIRE) ×1 IMPLANT
INQWIRE 1.5J .035X260CM (WIRE) ×2
KIT HEART LEFT (KITS) ×2 IMPLANT
PACK CARDIAC CATHETERIZATION (CUSTOM PROCEDURE TRAY) ×2 IMPLANT
TRANSDUCER W/STOPCOCK (MISCELLANEOUS) ×2 IMPLANT
TUBING CIL FLEX 10 FLL-RA (TUBING) ×2 IMPLANT

## 2017-12-09 NOTE — Research (Signed)
CADFEM Informed Consent   Subject Name: Joe Francis  Subject met inclusion and exclusion criteria.  The informed consent form, study requirements and expectations were reviewed with the subject and questions and concerns were addressed prior to the signing of the consent form.  The subject verbalized understanding of the trail requirements.  The subject agreed to participate in the CADFEM trial and signed the informed consent.  The informed consent was obtained prior to performance of any protocol-specific procedures for the subject.  A copy of the signed informed consent was given to the subject and a copy was placed in the subject's medical record.  Christena Flake 12/09/2017, 06:55 AM

## 2017-12-09 NOTE — Discharge Instructions (Signed)

## 2017-12-09 NOTE — Interval H&P Note (Signed)
History and Physical Interval Note:  12/09/2017 7:30 AM  Joe Francis  has presented today for surgery, with the diagnosis of cp (atypical angina) with Abnormal Nuclear Stress Test.   The various methods of treatment have been discussed with the patient and family. After consideration of risks, benefits and other options for treatment, the patient has consented to  Procedure(s): LEFT HEART CATH AND CORONARY ANGIOGRAPHY (N/A) with possible PERCUTANEOUS CORONARY INTERVENTION as a surgical intervention .  The patient's history has been reviewed, patient examined, no change in status, stable for surgery.  I have reviewed the patient's chart and labs.  Questions were answered to the patient's satisfaction.    Cath Lab Visit (complete for each Cath Lab visit)  Clinical Evaluation Leading to the Procedure:   ACS: No.  Non-ACS:    Anginal Classification: CCS II  Anti-ischemic medical therapy: No Therapy  Non-Invasive Test Results: Low-risk stress test findings: cardiac mortality <1%/year  Prior CABG: No previous CABG   [name]

## 2017-12-10 MED FILL — Heparin Sod (Porcine)-NaCl IV Soln 1000 Unit/500ML-0.9%: INTRAVENOUS | Qty: 1000 | Status: AC

## 2017-12-16 ENCOUNTER — Encounter: Payer: Self-pay | Admitting: Family Medicine

## 2017-12-16 ENCOUNTER — Ambulatory Visit (INDEPENDENT_AMBULATORY_CARE_PROVIDER_SITE_OTHER): Payer: 59 | Admitting: Family Medicine

## 2017-12-16 VITALS — BP 110/80 | HR 72 | Temp 98.2°F | Ht 74.0 in | Wt 286.7 lb

## 2017-12-16 DIAGNOSIS — F411 Generalized anxiety disorder: Secondary | ICD-10-CM

## 2017-12-16 DIAGNOSIS — Z6836 Body mass index (BMI) 36.0-36.9, adult: Secondary | ICD-10-CM

## 2017-12-16 DIAGNOSIS — R079 Chest pain, unspecified: Secondary | ICD-10-CM

## 2017-12-16 DIAGNOSIS — F330 Major depressive disorder, recurrent, mild: Secondary | ICD-10-CM | POA: Diagnosis not present

## 2017-12-16 MED ORDER — PAROXETINE HCL 20 MG PO TABS: 20.0000 mg | ORAL_TABLET | Freq: Every day | ORAL | 1 refills | Status: DC
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
## 2017-12-16 NOTE — Patient Instructions (Addendum)
BEFORE YOU LEAVE: -follow up:  1)4 weeks follow up  2)CPE fasting in 3 months  Start the Paxil and take once daily.  Consider cognitive behavioral therapy.  Follow up sooner/seek care if worsening or new concerns in the interim.  We recommend the following healthy lifestyle for LIFE: 1) Small portions. But, make sure to get regular (at least 3 per day), healthy meals and small healthy snacks if needed.  2) Eat a healthy clean diet.   TRY TO EAT: -at least 5-7 servings of low sugar, colorful, and nutrient rich vegetables per day (not corn, potatoes or bananas.) -berries are the best choice if you wish to eat fruit (only eat small amounts if trying to reduce weight)  -lean meets (fish, white meat of chicken or [place]) -vegan proteins for some meals - beans or tofu, whole grains, nuts and seeds -Replace bad fats with good fats - good fats include: fish, nuts and seeds, canola oil, olive oil -small amounts of low fat or non fat dairy -small amounts of100 % whole grains - check the lables -drink plenty of water  AVOID: -SUGAR, sweets, anything with added sugar, corn syrup or sweeteners - must read labels as even foods advertised as "healthy" often are loaded with sugar -if you must have a sweetener, small amounts of stevia may be best -sweetened beverages and artificially sweetened beverages -simple starches (rice, bread, potatoes, pasta, chips, etc - small amounts of 100% whole grains are ok) -red meat, pork, butter -fried foods, fast food, processed food, excessive dairy, eggs and coconut.  3)Get at least 150 minutes of sweaty aerobic exercise per week.  4)Reduce stress - consider counseling, meditation and relaxation to balance other aspects of your life.

## 2018-01-07 ENCOUNTER — Other Ambulatory Visit: Payer: Self-pay | Admitting: Family Medicine
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
## 2018-01-15 ENCOUNTER — Ambulatory Visit: Payer: 59 | Admitting: Family Medicine

## 2018-01-15 DIAGNOSIS — Z0289 Encounter for other administrative examinations: Secondary | ICD-10-CM

## 2018-01-20 ENCOUNTER — Encounter: Payer: Self-pay | Admitting: Family Medicine
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
## 2018-01-22 ENCOUNTER — Encounter: Payer: Self-pay | Admitting: Family Medicine

## 2018-01-22 ENCOUNTER — Ambulatory Visit (INDEPENDENT_AMBULATORY_CARE_PROVIDER_SITE_OTHER): Payer: 59 | Admitting: Family Medicine

## 2018-01-22 VITALS — BP 108/80 | HR 76 | Temp 97.9°F | Ht 74.0 in | Wt 287.2 lb

## 2018-01-22 DIAGNOSIS — F419 Anxiety disorder, unspecified: Secondary | ICD-10-CM | POA: Diagnosis not present

## 2018-01-22 DIAGNOSIS — E785 Hyperlipidemia, unspecified: Secondary | ICD-10-CM

## 2018-01-22 DIAGNOSIS — R739 Hyperglycemia, unspecified: Secondary | ICD-10-CM | POA: Diagnosis not present

## 2018-01-22 DIAGNOSIS — Z6836 Body mass index (BMI) 36.0-36.9, adult: Secondary | ICD-10-CM | POA: Diagnosis not present

## 2018-01-22 DIAGNOSIS — F3342 Major depressive disorder, recurrent, in full remission: Secondary | ICD-10-CM | POA: Diagnosis not present

## 2018-01-22 LAB — LIPID PANEL
CHOL/HDL RATIO: 6
Cholesterol: 216 mg/dL — ABNORMAL HIGH (ref 0–200)
HDL: 34.6 mg/dL — AB (ref >39.00)
Triglycerides: 412 mg/dL — ABNORMAL HIGH (ref 0.0–149.0)

## 2018-01-22 LAB — LDL CHOLESTEROL, DIRECT: LDL DIRECT: 130 mg/dL

## 2018-01-22 LAB — TSH: TSH: 1.96 u[IU]/mL (ref 0.35–4.50)

## 2018-01-22 LAB — HEMOGLOBIN A1C: Hgb A1c MFr Bld: 5.8 % (ref 4.6–6.5)

## 2018-01-22 LAB — VITAMIN B12: Vitamin B-12: 337 pg/mL (ref 211–911)

## 2018-01-22 MED ORDER — PAROXETINE HCL 10 MG PO TABS: 10.0000 mg | ORAL_TABLET | Freq: Every day | ORAL | 3 refills | Status: DC

## 2018-01-22 NOTE — Patient Instructions (Addendum)
BEFORE YOU LEAVE: -labs -follow up: 2-3 months  Decrease paxil to 10 mg  Start counseling  Get at least 150 minutes of aerobic exercise per week  Eat a healthy low sugar diet    We recommend the following healthy lifestyle for LIFE: 1) Small portions. But, make sure to get regular (at least 3 per day), healthy meals and small healthy snacks if needed.  2) Eat a healthy clean diet.   TRY TO EAT: -at least 5-7 servings of low sugar, colorful, and nutrient rich vegetables per day (not corn, potatoes or bananas.) -berries are the best choice if you wish to eat fruit (only eat small amounts if trying to reduce weight)  -lean meets (fish, white meat of chicken or [place]) -vegan proteins for some meals - beans or tofu, whole grains, nuts and seeds -Replace bad fats with good fats - good fats include: fish, nuts and seeds, canola oil, olive oil -small amounts of low fat or non fat dairy -small amounts of100 % whole grains - check the lables -drink plenty of water  AVOID: -SUGAR, sweets, anything with added sugar, corn syrup or sweeteners - must read labels as even foods advertised as "healthy" often are loaded with sugar -if you must have a sweetener, small amounts of stevia may be best -sweetened beverages and artificially sweetened beverages -simple starches (rice, bread, potatoes, pasta, chips, etc - small amounts of 100% whole grains are ok) -red meat, pork, butter -fried foods, fast food, processed food, excessive dairy, eggs and coconut.  3)Get at least 150 minutes of sweaty aerobic exercise per week.  4)Reduce stress - consider counseling, meditation and relaxation to balance other aspects of your life.

## 2018-02-06 ENCOUNTER — Ambulatory Visit (INDEPENDENT_AMBULATORY_CARE_PROVIDER_SITE_OTHER): Payer: 59 | Admitting: Endocrinology

## 2018-02-06 ENCOUNTER — Encounter: Payer: Self-pay | Admitting: Endocrinology

## 2018-02-06 VITALS — BP 120/88 | HR 75 | Ht 74.0 in | Wt 289.6 lb

## 2018-02-06 DIAGNOSIS — E291 Testicular hypofunction: Secondary | ICD-10-CM | POA: Diagnosis not present

## 2018-02-06 LAB — LUTEINIZING HORMONE: LH: 3.51 m[IU]/mL (ref 1.50–9.30)

## 2018-02-06 LAB — TESTOSTERONE: Testosterone: 173.25 ng/dL — ABNORMAL LOW (ref 300.00–890.00)
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
## 2018-02-08 ENCOUNTER — Encounter (INDEPENDENT_AMBULATORY_CARE_PROVIDER_SITE_OTHER): Payer: Self-pay

## 2018-02-08 MED ORDER — TESTOSTERONE 30 MG/ACT TD SOLN: TRANSDERMAL | 2 refills | Status: DC

## 2018-02-26 ENCOUNTER — Other Ambulatory Visit: Payer: Self-pay | Admitting: Family Medicine

## 2018-03-02 NOTE — Telephone Encounter (Signed)
Patient has not read his message regarding low testosterone level below 200 and starting Axiron.  Please confirm he has done this

## 2018-04-06 ENCOUNTER — Other Ambulatory Visit (INDEPENDENT_AMBULATORY_CARE_PROVIDER_SITE_OTHER): Payer: Self-pay

## 2018-04-06 ENCOUNTER — Other Ambulatory Visit: Payer: Self-pay | Admitting: Endocrinology

## 2018-04-06 DIAGNOSIS — E291 Testicular hypofunction: Secondary | ICD-10-CM

## 2018-04-06 DIAGNOSIS — Z131 Encounter for screening for diabetes mellitus: Secondary | ICD-10-CM

## 2018-04-06 LAB — CBC WITH DIFFERENTIAL/PLATELET
BASOS PCT: 0.7 % (ref 0.0–3.0)
Basophils Absolute: 0 10*3/uL (ref 0.0–0.1)
EOS ABS: 0.2 10*3/uL (ref 0.0–0.7)
Eosinophils Relative: 3.1 % (ref 0.0–5.0)
HEMATOCRIT: 47.9 % (ref 39.0–52.0)
HEMOGLOBIN: 16.3 g/dL (ref 13.0–17.0)
LYMPHS PCT: 27.2 % (ref 12.0–46.0)
Lymphs Abs: 1.6 10*3/uL (ref 0.7–4.0)
MCHC: 34.1 g/dL (ref 30.0–36.0)
MCV: 87.5 fl (ref 78.0–100.0)
MONOS PCT: 8.3 % (ref 3.0–12.0)
Monocytes Absolute: 0.5 10*3/uL (ref 0.1–1.0)
NEUTROS ABS: 3.6 10*3/uL (ref 1.4–7.7)
Neutrophils Relative %: 60.7 % (ref 43.0–77.0)
PLATELETS: 235 10*3/uL (ref 150.0–400.0)
RBC: 5.48 Mil/uL (ref 4.22–5.81)
RDW: 15 % (ref 11.5–15.5)
WBC: 5.9 10*3/uL (ref 4.0–10.5)

## 2018-04-06 LAB — TESTOSTERONE: Testosterone: 386.96 ng/dL (ref 300.00–890.00)

## 2018-04-08 ENCOUNTER — Other Ambulatory Visit: Payer: 59

## 2018-08-04 ENCOUNTER — Other Ambulatory Visit: Payer: 59

## 2018-08-07 ENCOUNTER — Other Ambulatory Visit: Payer: Self-pay | Admitting: Endocrinology

## 2018-08-07 ENCOUNTER — Ambulatory Visit: Payer: 59 | Admitting: Endocrinology

## 2018-08-07 DIAGNOSIS — E291 Testicular hypofunction: Secondary | ICD-10-CM

## 2018-08-07 DIAGNOSIS — Z0289 Encounter for other administrative examinations: Secondary | ICD-10-CM

## 2018-10-02 ENCOUNTER — Telehealth: Payer: Self-pay | Admitting: Endocrinology

## 2018-10-02 NOTE — Telephone Encounter (Signed)
LMTCB and reschedule labs and follow up per Dr Lucianne Muss

## 2019-01-19 ENCOUNTER — Telehealth: Payer: Self-pay | Admitting: Endocrinology

## 2019-01-19 NOTE — Telephone Encounter (Signed)
Attempted to call patient and reschedule over due labs and appointment, no answer, and voicemail not accepting calls

## 2019-03-08 ENCOUNTER — Telehealth: Payer: Self-pay | Admitting: Endocrinology

## 2019-03-08 NOTE — Telephone Encounter (Signed)
MEDICATION: Testosterone 30 MG/ACT SOLN   [id number]   IS THIS A 90 DAY SUPPLY :   IS PATIENT OUT OF MEDICATION:   IF NOT; HOW MUCH IS LEFT:   LAST APPOINTMENT DATE: @5 /26/2020  NEXT APPOINTMENT DATE:@Visit  date not found  DO WE HAVE YOUR PERMISSION TO LEAVE A DETAILED MESSAGE:  OTHER COMMENTS:    **Let patient know to contact pharmacy at the end of the day to make sure medication is ready. **  ** Please notify patient to allow 48-72 hours to process**  **Encourage patient to contact the pharmacy for refills or they can request refills through [facility]**

## 2019-03-09 NOTE — Telephone Encounter (Signed)
Please refill if appropriate

## 2019-03-09 NOTE — Telephone Encounter (Signed)
Needs to be scheduled for labs and follow-up appointment

## 2019-03-09 NOTE — Telephone Encounter (Signed)
Called pt and left detailed voicemail with MD message and requested that he call back and make a follow up appt with blood work.

## 2019-04-16 ENCOUNTER — Other Ambulatory Visit: Payer: Self-pay

## 2019-04-16 ENCOUNTER — Other Ambulatory Visit (INDEPENDENT_AMBULATORY_CARE_PROVIDER_SITE_OTHER): Payer: Managed Care, Other (non HMO)

## 2019-04-16 DIAGNOSIS — E291 Testicular hypofunction: Secondary | ICD-10-CM | POA: Diagnosis not present

## 2019-04-16 LAB — CBC
HCT: 42.5 % (ref 39.0–52.0)
Hemoglobin: 14.2 g/dL (ref 13.0–17.0)
MCHC: 33.4 g/dL (ref 30.0–36.0)
MCV: 86.7 fl (ref 78.0–100.0)
Platelets: 206 10*3/uL (ref 150.0–400.0)
RBC: 4.9 Mil/uL (ref 4.22–5.81)
RDW: 14.8 % (ref 11.5–15.5)
WBC: 6.5 10*3/uL (ref 4.0–10.5)

## 2019-04-16 LAB — TESTOSTERONE: Testosterone: 113.19 ng/dL — ABNORMAL LOW (ref 300.00–890.00)

## 2019-04-20 ENCOUNTER — Other Ambulatory Visit: Payer: Self-pay

## 2019-04-20 ENCOUNTER — Encounter: Payer: Self-pay | Admitting: Endocrinology

## 2019-04-20 ENCOUNTER — Ambulatory Visit (INDEPENDENT_AMBULATORY_CARE_PROVIDER_SITE_OTHER): Payer: Self-pay | Admitting: Endocrinology

## 2019-04-20 VITALS — BP 124/76 | HR 82 | Ht 74.0 in | Wt 276.4 lb

## 2019-04-20 DIAGNOSIS — E291 Testicular hypofunction: Secondary | ICD-10-CM
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
## 2019-04-23 ENCOUNTER — Telehealth: Payer: Self-pay | Admitting: Endocrinology

## 2019-04-23 NOTE — Telephone Encounter (Signed)
Pt called about Rx of Testosterone 30 MG/ACT SOLN that was not sent to pharmacy .   [id number] [place], [place] - [address]   Call pt @ [phone]. Thank you!

## 2019-04-26 ENCOUNTER — Telehealth: Payer: Self-pay | Admitting: Endocrinology

## 2019-04-26 ENCOUNTER — Other Ambulatory Visit: Payer: Self-pay | Admitting: Endocrinology

## 2019-04-26 MED ORDER — TESTOSTERONE 30 MG/ACT TD SOLN: TRANSDERMAL | 1 refills | Status: DC

## 2019-04-26 NOTE — Telephone Encounter (Signed)
Patient went to pick up his prescription at the pharmacy and they informed in that it would be over $1000 for a 3 month supply.  He would like to know if there is an alternative that could be called in for him.  He can not afford $1000/month  Paroxetine(Paxil)   Call back # [phone]

## 2019-04-26 NOTE — Telephone Encounter (Signed)
Blood prefer not to use shots.  He can check with the insurance to see if they will cover a day generic Androderm or AndroGel

## 2019-04-26 NOTE — Telephone Encounter (Signed)
done

## 2019-04-26 NOTE — Telephone Encounter (Signed)
Called pt and left detailed voicemail with MD message. 

## 2019-04-26 NOTE — Telephone Encounter (Signed)
Called pt to confirm what medication he was referring to, and he stated that it was his testosterone that was too expensive. He stated that he has no problem taking shots, if this would be a viable alternative.

## 2019-04-30 ENCOUNTER — Other Ambulatory Visit: Payer: Self-pay | Admitting: Endocrinology

## 2019-04-30 MED ORDER — TESTOSTERONE 12.5 MG/ACT (1%) TD GEL: TRANSDERMAL | 1 refills | Status: DC

## 2019-04-30 NOTE — Telephone Encounter (Signed)
MEDICATION: Vogelxo  PHARMACY:  [id number] [place], [place] - [address]  IS THIS A 90 DAY SUPPLY :   IS PATIENT OUT OF MEDICATION:   IF NOT; HOW MUCH IS LEFT:   LAST APPOINTMENT DATE: @8 /28/2020  NEXT APPOINTMENT DATE:@10 /27/2020  DO WE HAVE YOUR PERMISSION TO LEAVE A DETAILED MESSAGE:  OTHER COMMENTS:  Patient stated this RX is covered with his insurance. Please call to inform if he can use it.  **Let patient know to contact pharmacy at the end of the day to make sure medication is ready. **  ** Please notify patient to allow 48-72 hours to process**  **Encourage patient to contact the pharmacy for refills or they can request refills through [facility]**

## 2019-04-30 NOTE — Telephone Encounter (Signed)
Assuming this is the replacement to testosterone previously denied by insurance.

## 2019-04-30 NOTE — Telephone Encounter (Signed)
done

## 2019-05-06 ENCOUNTER — Telehealth: Payer: Self-pay

## 2019-05-06 NOTE — Telephone Encounter (Signed)
PA initiated via [email or web address] for [employer]. DOUG [facility] Key: [id number] - Rx #: [id number] Need help? Call us at [phone] Status Sent to [facility] Testosterone 12.5 MG/ACT(1%) gel Form [employer] Electronic PA Form (NCPDP) Original Claim Info 75 +MUST USE TESTOSTERONE GEL(EX. AUTHORIZED GX FOR TESTIM VOGELXO)TESTOSTERONE SOL._ANDRODERM PA ONLY [id number] DRUG REQUIRES PRIOR AUTHORIZATION(PHARMACY HELP DESK [phone])

## 2019-05-13 ENCOUNTER — Telehealth: Payer: Self-pay | Admitting: Endocrinology

## 2019-05-13 NOTE — Telephone Encounter (Signed)
MEDICATION: Axiron  PHARMACY:  [facility] [place] : YES  IS PATIENT OUT OF MEDICATION:   IF NOT; HOW MUCH IS LEFT:   LAST APPOINTMENT DATE: @9 /05/2019  NEXT APPOINTMENT DATE:@10 /27/2020  DO WE HAVE YOUR PERMISSION TO LEAVE A DETAILED MESSAGE: YES [phone]  OTHER COMMENTS:    **Let patient know to contact pharmacy at the end of the day to make sure medication is ready. **  ** Please notify patient to allow 48-72 hours to process**  **Encourage patient to contact the pharmacy for refills or they can request refills through [facility]**

## 2019-05-13 NOTE — Telephone Encounter (Signed)
He was just prescribed the preferred product Vogelexo on 9/5.  This may be an inappropriate refill request

## 2019-05-14 NOTE — Telephone Encounter (Signed)
Called pt and left detailed voicemail requesting a callback to explain what he is needing since this refill request is not what was most recently prescribed.

## 2019-05-17 ENCOUNTER — Other Ambulatory Visit: Payer: Self-pay | Admitting: Endocrinology

## 2019-05-17 MED ORDER — TESTOSTERONE 30 MG/ACT TD SOLN: TRANSDERMAL | 2 refills | Status: DC

## 2019-05-17 NOTE — Telephone Encounter (Signed)
Pt called returning your phone call. Please call pt at [phone]

## 2019-05-17 NOTE — Telephone Encounter (Signed)
done

## 2019-05-17 NOTE — Telephone Encounter (Signed)
Called pt back and he did stated that requesting the [facility] not a mistake. When he went to pick up the Vogelexo, he found out it would cost $400 for a 30 day supply. He was able to obtain a copay card for Axiron that would allow him to get a 90 day supply for the same amount if it is filled at [facility].

## 2019-05-19 ENCOUNTER — Telehealth: Payer: Self-pay | Admitting: Endocrinology

## 2019-05-19 NOTE — Telephone Encounter (Signed)
The Rx that you previously sent was supposed to go to [name].

## 2019-05-19 NOTE — Telephone Encounter (Signed)
[facility]  Patient states supply is for 3 mo

## 2019-05-19 NOTE — Telephone Encounter (Signed)
I will recent prescription when I am back in the office, unable to do controlled prescriptions currently.  Please see if another provider can do this

## 2019-05-20 ENCOUNTER — Other Ambulatory Visit: Payer: Self-pay

## 2019-05-20 MED ORDER — TESTOSTERONE 30 MG/ACT TD SOLN
TRANSDERMAL | 2 refills | Status: DC
Start: 2019-05-20 — End: 2019-10-13

## 2019-05-20 MED ORDER — TESTOSTERONE 30 MG/ACT TD SOLN: TRANSDERMAL | 2 refills | Status: DC

## 2019-05-20 NOTE — Addendum Note (Signed)
Addended by: Dorita Sciara on: 05/20/2019 11:16 AM   Modules accepted: Orders

## 2019-05-20 NOTE — Telephone Encounter (Signed)
Dr[facility] Kelton Pillar[facility].

## 2019-05-20 NOTE — Telephone Encounter (Signed)
Dr. Kelton Pillar, could you please refill this in Dr. Ronnie Derby absence? The pt would like the refill sent to [facility]. Thank you.

## 2019-05-20 NOTE — Telephone Encounter (Signed)
Patient has called in regards to this RX needing to be sent to [facility].

## 2019-06-22 ENCOUNTER — Other Ambulatory Visit: Payer: Self-pay

## 2019-06-22 ENCOUNTER — Other Ambulatory Visit (INDEPENDENT_AMBULATORY_CARE_PROVIDER_SITE_OTHER): Payer: Managed Care, Other (non HMO)

## 2019-06-22 DIAGNOSIS — E291 Testicular hypofunction: Secondary | ICD-10-CM | POA: Diagnosis not present

## 2019-06-22 LAB — CBC
HCT: 44.5 % (ref 39.0–52.0)
Hemoglobin: 14.9 g/dL (ref 13.0–17.0)
MCHC: 33.4 g/dL (ref 30.0–36.0)
MCV: 86.4 fl (ref 78.0–100.0)
Platelets: 239 10*3/uL (ref 150.0–400.0)
RBC: 5.15 Mil/uL (ref 4.22–5.81)
RDW: 14.9 % (ref 11.5–15.5)
WBC: 6.2 10*3/uL (ref 4.0–10.5)

## 2019-06-24 ENCOUNTER — Ambulatory Visit (INDEPENDENT_AMBULATORY_CARE_PROVIDER_SITE_OTHER): Payer: Managed Care, Other (non HMO) | Admitting: Family Medicine

## 2019-06-24 ENCOUNTER — Ambulatory Visit: Payer: Managed Care, Other (non HMO) | Admitting: Family Medicine

## 2019-06-24 ENCOUNTER — Encounter: Payer: Self-pay | Admitting: Family Medicine

## 2019-06-24 ENCOUNTER — Other Ambulatory Visit: Payer: Self-pay

## 2019-06-24 VITALS — BP 108/78 | HR 96 | Temp 96.8°F | Ht 74.0 in | Wt 280.0 lb

## 2019-06-24 DIAGNOSIS — E291 Testicular hypofunction: Secondary | ICD-10-CM | POA: Diagnosis not present

## 2019-06-24 DIAGNOSIS — E781 Pure hyperglyceridemia: Secondary | ICD-10-CM | POA: Diagnosis not present

## 2019-06-24 LAB — TESTOSTERONE: Testosterone: 791.09 ng/dL (ref 300.00–890.00)
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
## 2019-06-24 NOTE — Patient Instructions (Signed)
Preventing High Cholesterol Cholesterol is a white, waxy substance similar to fat that the human body needs to help build cells. The liver makes all the cholesterol that a person's body needs. Having high cholesterol (hypercholesterolemia) increases a person's risk for heart disease and stroke. Extra (excess) cholesterol comes from the food the person eats. High cholesterol can often be prevented with diet and lifestyle changes. If you already have high cholesterol, you can control it with diet and lifestyle changes and with medicine. How can high cholesterol affect me? If you have high cholesterol, deposits (plaques) may build up on the walls of your arteries. The arteries are the blood vessels that carry blood away from your heart. Plaques make the arteries narrower and stiffer. This can limit or block blood flow and cause blood clots to form. Blood clots:  Are tiny balls of cells that form in your blood.  Can move to the heart or brain, causing a heart attack or stroke. Plaques in arteries greatly increase your risk for heart attack and stroke.Making diet and lifestyle changes can reduce your risk for these conditions that may threaten your life. What can increase my risk? This condition is more likely to develop in people who:  Eat foods that are high in saturated fat or cholesterol. Saturated fat is mostly found in: ? Foods that contain animal fat, such as red meat and some dairy products. ? Certain fatty foods made from plants, such as tropical oils.  Are overweight.  Are not getting enough exercise.  Have a family history of high cholesterol. What actions can I take to prevent this? Nutrition   Eat less saturated fat.  Avoid trans fats (partially hydrogenated oils). These are often found in margarine and in some baked goods, fried foods, and snacks bought in packages.  Avoid precooked or cured meat, such as sausages or meat loaves.  Avoid foods and drinks that have added  sugars.  Eat more fruits, vegetables, and whole grains.  Choose healthy sources of protein, such as fish, poultry, lean cuts of red meat, beans, peas, lentils, and nuts.  Choose healthy sources of fat, such as: ? Nuts. ? Vegetable oils, especially olive oil. ? Fish that have healthy fats (omega-3 fatty acids), such as mackerel or salmon. The items listed above may not be a complete list of recommended foods and beverages. Contact a dietitian for more information. Lifestyle  Lose weight if you are overweight. Losing 5-10 lb (2.3-4.5 kg) can help prevent or control high cholesterol. It can also lower your risk for diabetes and high blood pressure. Ask your health care provider to help you with a diet and exercise plan to lose weight safely.  Do not use any products that contain nicotine or tobacco, such as cigarettes, e-cigarettes, and chewing tobacco. If you need help quitting, ask your health care provider.  Limit your alcohol intake. ? Do not drink alcohol if:  Your health care provider tells you not to drink.  You are pregnant, may be pregnant, or are planning to become pregnant. ? If you drink alcohol:  Limit how much you use to:  0-1 drink a day for women.  0-2 drinks a day for men.  Be aware of how much alcohol is in your drink. In the [place] one drink equals one 12 oz bottle of beer (355 mL), one 5 oz glass of wine (148 mL), or one 1 oz glass of hard liquor (44 mL). Activity   Get enough exercise. Each week, do at   least 150 minutes of exercise that takes a medium level of effort (moderate-intensity exercise). ? This is exercise that:  Makes your heart beat faster and makes you breathe harder than usual.  Allows you to still be able to talk. ? You could exercise in short sessions several times a day or longer sessions a few times a week. For example, on 5 days each week, you could walk fast or ride your bike 3 times a day for 10 minutes each time.  Do exercises as told  by your health care provider. Medicines  In addition to diet and lifestyle changes, your health care provider may recommend medicines to help lower cholesterol. This may be a medicine to lower the amount of cholesterol your liver makes. You may need medicine if: ? Diet and lifestyle changes do not lower your cholesterol enough. ? You have high cholesterol and other risk factors for heart disease or stroke.  Take over-the-counter and prescription medicines only as told by your health care provider. General information  Manage your risk factors for high cholesterol. Talk with your health care provider about all your risk factors and how to lower your risk.  Manage other conditions that you have, such as diabetes or high blood pressure (hypertension).  Have blood tests to check your cholesterol levels at regular points in time as told by your health care provider.  Keep all follow-up visits as told by your health care provider. This is important. Where to find more information  American Heart Association: www.heart.org  National Heart, Lung, and Blood Institute: Pop[employer].iswww.nhlbi.nih.gov Summary  High cholesterol increases your risk for heart disease and stroke. By keeping your cholesterol level low, you can reduce your risk for these conditions.  High cholesterol can often be prevented with diet and lifestyle changes.  Work with your health care provider to manage your risk factors, and have your blood tested regularly. This information is not intended to replace advice given to you by your health care provider. Make sure you discuss any questions you have with your health care provider. Document Released: 08/27/2015 Document Revised: 12/04/2018 Document Reviewed: 04/20/2016 Elsevier Patient Education  2020 Elsevier Inc.  Fungal Nail Infection A fungal nail infection is a common infection of the toenails or fingernails. This condition affects toenails more often than fingernails. It often  affects the great, or big, toes. More than one nail may be infected. The condition can be passed from person to person (is contagious). What are the causes? This condition is caused by a fungus. Several types of fungi can cause the infection. These fungi are common in moist and warm areas. If your hands or feet come into contact with the fungus, it may get into a crack in your fingernail or toenail and cause the infection. What increases the risk? The following factors may make you more likely to develop this condition:  Being male.  Being of older age.  Living with someone who has the fungus.  Walking barefoot in areas where the fungus thrives, such as showers or locker rooms.  Wearing shoes and socks that cause your feet to sweat.  Having a nail injury or a recent nail surgery.  Having certain medical conditions, such as: ? Athlete's foot. ? Diabetes. ? Psoriasis. ? Poor circulation. ? A weak body defense system (immune system). What are the signs or symptoms? Symptoms of this condition include:  A pale spot on the nail.  Thickening of the nail.  A nail that becomes yellow or brown.  A brittle or ragged nail edge.  A crumbling nail.  A nail that has lifted away from the nail bed. How is this diagnosed? This condition is diagnosed with a physical exam. Your health care provider may take a scraping or clipping from your nail to test for the fungus. How is this treated? Treatment is not needed for mild infections. If you have significant nail changes, treatment may include:  Antifungal medicines taken by mouth (orally). You may need to take the medicine for several weeks or several months, and you may not see the results for a long time. These medicines can cause side effects. Ask your health care provider what problems to watch for.  Antifungal nail polish or nail cream. These may be used along with oral antifungal medicines.  Laser treatment of the nail.  Surgery to  remove the nail. This may be needed for the most severe infections. It can take a long time, usually up to a year, for the infection to go away. The infection may also come back. Follow these instructions at home: Medicines  Take or apply over-the-counter and prescription medicines only as told by your health care provider.  Ask your health care provider about using over-the-counter mentholated ointment on your nails. Nail care  Trim your nails often.  Wash and dry your hands and feet every day.  Keep your feet dry: ? Wear absorbent socks, and change your socks frequently. ? Wear shoes that allow air to circulate, such as sandals or canvas tennis shoes. Throw out old shoes.  Do not use artificial nails.  If you go to a nail salon, make sure you choose one that uses clean instruments.  Use antifungal foot powder on your feet and in your shoes. General instructions  Do not share personal items, such as towels or nail clippers.  Do not walk barefoot in shower rooms or locker rooms.  Wear rubber gloves if you are working with your hands in wet areas.  Keep all follow-up visits as told by your health care provider. This is important. Contact a health care provider if: Your infection is not getting better or it is getting worse after several months. Summary  A fungal nail infection is a common infection of the toenails or fingernails.  Treatment is not needed for mild infections. If you have significant nail changes, treatment may include taking medicine orally and applying medicine to your nails.  It can take a long time, usually up to a year, for the infection to go away. The infection may also come back.  Take or apply over-the-counter and prescription medicines only as told by your health care provider.  Follow instructions for taking care of your nails to help prevent infection from coming back or spreading. This information is not intended to replace advice given to you  by your health care provider. Make sure you discuss any questions you have with your health care provider. Document Released: 08/09/2000 Document Revised: 12/03/2018 Document Reviewed: 01/16/2018 Elsevier Patient Education  2020 [employer].  Colonoscopy, Adult A colonoscopy is an exam to look at the large intestine. It is done to check for problems, such as:  Lumps (tumors).  Growths (polyps).  Swelling (inflammation).  Bleeding. What happens before the procedure? Eating and drinking Follow instructions from your doctor about eating and drinking. These instructions may include:  A few days before the procedure - follow a low-fiber diet. ? Avoid nuts. ? Avoid seeds. ? Avoid dried fruit. ? Avoid raw fruits. ?  Avoid vegetables.  1-3 days before the procedure - follow a clear liquid diet. Avoid liquids that have red or purple dye. Drink only clear liquids, such as: ? Clear broth or bouillon. ? Black coffee or tea. ? Clear juice. ? Clear soft drinks or sports drinks. ? Gelatin dessert. ? Popsicles.  On the day of the procedure - do not eat or drink anything during the 2 hours before the procedure. Up to 2 hours before the procedure, you may continue to drink clear liquids, such as water or clear fruit juice.  Bowel prep If you were prescribed an oral bowel prep:  Take it as told by your doctor. Starting the day before your procedure, you will need to drink a lot of liquid. The liquid will cause you to poop (have bowel movements) until your poop is almost clear or light green.  To clean out your colon, you may also be given: ? Laxative medicines. ? Instructions about how to use an enema.  If your skin or butt gets irritated from diarrhea, you may: ? Wipe the area with wipes that have medicine in them, such as adult wet wipes with aloe and vitamin E. ? Put something on your skin that soothes the area, such as petroleum jelly.  If you throw up (vomit) while drinking the  bowel prep, take a break for up to 60 minutes. Then begin the bowel prep again. If you keep throwing up and you cannot take the bowel prep without throwing up, call your doctor. General instructions  Ask your doctor about: ? Changing or stopping your normal medicines. This is important if you take iron pills, diabetes medicines, or blood thinners. ? Taking medicines such as aspirin and ibuprofen. These medicines can thin your blood. Do not take these medicines unless your doctor tells you to take them.  Plan to have someone take you home from the hospital or clinic. What happens during the procedure?   An IV tube may be put into one of your veins.  You will be given medicine to help you relax (sedative).  To reduce your risk of infection: ? Your doctors will wash their hands. ? Your anal area will be washed with soap.  You will be asked to lie on your side with your knees bent.  Your doctor will get a long, thin, flexible tube ready. The tube will have a camera and a light on the end.  The tube will be put into your anus.  The tube will be gently put into your large intestine.  Air will be delivered into your large intestine to keep it open. You may feel some pressure or cramping.  The camera will be used to take photos.  A small tissue sample may be removed for testing (biopsy).  If small growths are found, your doctor may remove them and have them checked for cancer.  The tube that was put into your anus will be slowly removed. The procedure may vary among doctors and hospitals. What happens after the procedure?  Your doctor will check on you often until the medicines you were given have worn off.  Do not drive for 24 hours after the procedure.  You may have a small amount of blood in your poop.  You may pass gas.  You may have mild cramps or bloating in your belly (abdomen).  It is up to you to get the results of your procedure. Ask your doctor, or the department  performing the procedure, when  your results will be ready. Summary  A colonoscopy is an exam to look at the large intestine.  Follow instructions from your doctor about eating and drinking before the procedure.  If you were prescribed an oral bowel prep to clean out your colon, take it as told by your doctor.  Your doctor will check on you often until the medicines you were given have worn off.  Plan to have someone take you home from the hospital or clinic. This information is not intended to replace advice given to you by your health care provider. Make sure you discuss any questions you have with your health care provider. Document Released: 09/14/2010 Document Revised: 06/11/2017 Document Reviewed: 10/24/2015 Elsevier Patient Education  2020 [employer].

## 2019-09-01 ENCOUNTER — Telehealth: Payer: Managed Care, Other (non HMO) | Admitting: Nurse Practitioner

## 2019-09-01 DIAGNOSIS — M545 Low back pain, unspecified: Secondary | ICD-10-CM

## 2019-09-01 MED ORDER — NAPROXEN 500 MG PO TABS: 500.0000 mg | ORAL_TABLET | Freq: Two times a day (BID) | ORAL | 1 refills | Status: DC

## 2019-09-01 MED ORDER — CYCLOBENZAPRINE HCL 10 MG PO TABS: 10.0000 mg | ORAL_TABLET | Freq: Three times a day (TID) | ORAL | 1 refills | Status: DC | PRN

## 2019-09-01 NOTE — Progress Notes (Signed)

## 2019-10-13 ENCOUNTER — Other Ambulatory Visit (INDEPENDENT_AMBULATORY_CARE_PROVIDER_SITE_OTHER): Payer: Managed Care, Other (non HMO)

## 2019-10-13 ENCOUNTER — Other Ambulatory Visit: Payer: Self-pay

## 2019-10-13 ENCOUNTER — Other Ambulatory Visit: Payer: Self-pay | Admitting: Internal Medicine

## 2019-10-13 ENCOUNTER — Other Ambulatory Visit: Payer: Self-pay | Admitting: Endocrinology

## 2019-10-13 ENCOUNTER — Telehealth: Payer: Self-pay | Admitting: Endocrinology

## 2019-10-13 DIAGNOSIS — E291 Testicular hypofunction: Secondary | ICD-10-CM | POA: Diagnosis not present

## 2019-10-13 LAB — COMPREHENSIVE METABOLIC PANEL
ALT: 23 U/L (ref 0–53)
AST: 26 U/L (ref 0–37)
Albumin: 4.2 g/dL (ref 3.5–5.2)
Alkaline Phosphatase: 95 U/L (ref 39–117)
BUN: 13 mg/dL (ref 6–23)
CO2: 27 mEq/L (ref 19–32)
Calcium: 9.1 mg/dL (ref 8.4–10.5)
Chloride: 105 mEq/L (ref 96–112)
Creatinine, Ser: 1.12 mg/dL (ref 0.40–1.50)
GFR: 69.24 mL/min (ref >60.00)
Glucose, Bld: 105 mg/dL — ABNORMAL HIGH (ref 70–99)
Potassium: 3.5 mEq/L (ref 3.5–5.1)
Sodium: 139 mEq/L (ref 135–145)
Total Bilirubin: 0.4 mg/dL (ref 0.2–1.2)
Total Protein: 6.6 g/dL (ref 6.0–8.3)

## 2019-10-13 LAB — CBC
HCT: 44.6 % (ref 39.0–52.0)
Hemoglobin: 15.1 g/dL (ref 13.0–17.0)
MCHC: 33.8 g/dL (ref 30.0–36.0)
MCV: 84.8 fl (ref 78.0–100.0)
Platelets: 206 10*3/uL (ref 150.0–400.0)
RBC: 5.26 Mil/uL (ref 4.22–5.81)
RDW: 15.4 % (ref 11.5–15.5)
WBC: 6.6 10*3/uL (ref 4.0–10.5)

## 2019-10-13 LAB — TESTOSTERONE: Testosterone: 629.26 ng/dL (ref 300.00–890.00)

## 2019-10-13 MED ORDER — TESTOSTERONE 30 MG/ACT TD SOLN: TRANSDERMAL | 1 refills | Status: DC

## 2019-10-13 NOTE — Telephone Encounter (Signed)
MEDICATION: Testosterone  PHARMACY:  [facility] on [address]  IS THIS A 90 DAY SUPPLY :   IS PATIENT OUT OF MEDICATION: yes  IF NOT; HOW MUCH IS LEFT:   LAST APPOINTMENT DATE: @9 /24/2020  NEXT APPOINTMENT DATE:@2 /19/2021  DO WE HAVE YOUR PERMISSION TO LEAVE A DETAILED MESSAGE: yes  OTHER COMMENTS:    **Let patient know to contact pharmacy at the end of the day to make sure medication is ready. **  ** Please notify patient to allow 48-72 hours to process**  **Encourage patient to contact the pharmacy for refills or they can request refills through [facility]**

## 2019-10-13 NOTE — Telephone Encounter (Signed)
Please refill if appropriate

## 2019-10-15 ENCOUNTER — Other Ambulatory Visit: Payer: Managed Care, Other (non HMO)

## 2019-10-15 NOTE — Telephone Encounter (Signed)
Patient called re: RX for Testosterone 30 MG/ACT SOLN was sent to the wrong Pharmacy. Patient requests the following:  MEDICATION: Testosterone 30 MG/ACT SOLN  PHARMACY:  [facility] at [facility] located at [facility] in [place] [address] Ph# [phone] (Patient does not have Fax#)  IS THIS A 90 DAY SUPPLY : Yes  IS PATIENT OUT OF MEDICATION: Yes  IF NOT; HOW MUCH IS LEFT: 0  LAST APPOINTMENT DATE: @9 /24/2020  NEXT APPOINTMENT DATE:@2 /23/2021  DO WE HAVE YOUR PERMISSION TO LEAVE A DETAILED MESSAGE: Yes  OTHER COMMENTS:    **Let patient know to contact pharmacy at the end of the day to make sure medication is ready. **  ** Please notify patient to allow 48-72 hours to process**  **Encourage patient to contact the pharmacy for refills or they can request refills through [facility]**

## 2019-10-16 ENCOUNTER — Other Ambulatory Visit: Payer: Self-pay | Admitting: Endocrinology

## 2019-10-16 MED ORDER — TESTOSTERONE 30 MG/ACT TD SOLN: TRANSDERMAL | 1 refills | Status: DC

## 2019-10-19 ENCOUNTER — Encounter: Payer: Self-pay | Admitting: Endocrinology

## 2019-10-19 ENCOUNTER — Ambulatory Visit (INDEPENDENT_AMBULATORY_CARE_PROVIDER_SITE_OTHER): Payer: Self-pay | Admitting: Endocrinology

## 2019-10-19 ENCOUNTER — Other Ambulatory Visit: Payer: Self-pay

## 2019-10-19 VITALS — BP 122/70 | HR 71 | Ht 74.0 in | Wt 285.4 lb

## 2019-10-19 DIAGNOSIS — E291 Testicular hypofunction: Secondary | ICD-10-CM

## 2019-10-19 MED ORDER — SILDENAFIL CITRATE 100 MG PO TABS: 100.0000 mg | ORAL_TABLET | Freq: Every day | ORAL | 0 refills | Status: DC | PRN
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
## 2020-04-14 ENCOUNTER — Other Ambulatory Visit: Payer: Self-pay | Admitting: Endocrinology

## 2020-04-14 ENCOUNTER — Other Ambulatory Visit: Payer: Self-pay

## 2020-04-14 ENCOUNTER — Other Ambulatory Visit (INDEPENDENT_AMBULATORY_CARE_PROVIDER_SITE_OTHER): Payer: Self-pay

## 2020-04-14 DIAGNOSIS — Z131 Encounter for screening for diabetes mellitus: Secondary | ICD-10-CM

## 2020-04-14 DIAGNOSIS — E291 Testicular hypofunction: Secondary | ICD-10-CM

## 2020-04-14 DIAGNOSIS — Z6836 Body mass index (BMI) 36.0-36.9, adult: Secondary | ICD-10-CM

## 2020-04-14 LAB — CBC
HCT: 46.8 % (ref 39.0–52.0)
Hemoglobin: 15.7 g/dL (ref 13.0–17.0)
MCHC: 33.6 g/dL (ref 30.0–36.0)
MCV: 85.1 fl (ref 78.0–100.0)
Platelets: 214 10*3/uL (ref 150.0–400.0)
RBC: 5.49 Mil/uL (ref 4.22–5.81)
RDW: 15.4 % (ref 11.5–15.5)
WBC: 7.6 10*3/uL (ref 4.0–10.5)

## 2020-04-14 LAB — GLUCOSE, RANDOM: Glucose, Bld: 92 mg/dL (ref 70–99)

## 2020-04-17 ENCOUNTER — Encounter: Payer: Self-pay | Admitting: Endocrinology

## 2020-04-17 ENCOUNTER — Other Ambulatory Visit: Payer: Self-pay

## 2020-04-17 ENCOUNTER — Ambulatory Visit (INDEPENDENT_AMBULATORY_CARE_PROVIDER_SITE_OTHER): Payer: No Typology Code available for payment source | Admitting: Endocrinology

## 2020-04-17 VITALS — BP 120/70 | HR 71 | Ht 74.0 in | Wt 282.4 lb

## 2020-04-17 DIAGNOSIS — E291 Testicular hypofunction: Secondary | ICD-10-CM

## 2020-04-17 LAB — TESTOSTERONE, TOTAL, LC/MS/MS: Testosterone, total: 2048.8 ng/dL — ABNORMAL HIGH (ref 264.0–916.0)

## 2020-04-17 MED ORDER — TESTOSTERONE 30 MG/ACT TD SOLN
TRANSDERMAL | 1 refills | Status: DC
Start: 2020-04-17 — End: 2020-08-03

## 2020-04-17 MED ORDER — SILDENAFIL CITRATE 100 MG PO TABS: 100.0000 mg | ORAL_TABLET | Freq: Every day | ORAL | 1 refills | Status: DC | PRN
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
## 2020-04-18 ENCOUNTER — Ambulatory Visit: Payer: Managed Care, Other (non HMO) | Admitting: Endocrinology

## 2020-04-20 ENCOUNTER — Telehealth: Payer: Self-pay | Admitting: Endocrinology

## 2020-04-20 NOTE — Telephone Encounter (Signed)
Please asked the pharmacist the specific question that needs to be clarified

## 2020-04-20 NOTE — Telephone Encounter (Signed)
Joe Francis (But ask for Pharmacist) at [name] located at [address] requests to be called at ph# [phone]  tfor clarification of dosage, quantity and strength of RX for topical testosterone.

## 2020-04-20 NOTE — Telephone Encounter (Signed)
Pharmacy requesting clarification

## 2020-04-24 NOTE — Telephone Encounter (Signed)
Spoke with Gyan(Pharmacist) Clarification given for prescription per Dr. Remus Blake response.

## 2020-04-24 NOTE — Telephone Encounter (Signed)
Pharmacist would like to know if this is a pump, dosage, where testosterone is being applied , is this a 30 day or 90 day prescription. Please advise.

## 2020-04-24 NOTE — Telephone Encounter (Signed)
He is getting the generic Axiron liquid in the pump, applying 2 pumps under one arm and one on the other underarm daily.  90-day supplies okay

## 2020-05-03 ENCOUNTER — Other Ambulatory Visit (INDEPENDENT_AMBULATORY_CARE_PROVIDER_SITE_OTHER): Payer: Self-pay

## 2020-05-03 ENCOUNTER — Other Ambulatory Visit: Payer: Self-pay

## 2020-05-03 DIAGNOSIS — E291 Testicular hypofunction: Secondary | ICD-10-CM

## 2020-05-03 LAB — TESTOSTERONE: Testosterone: 222.68 ng/dL — ABNORMAL LOW (ref 300.00–890.00)

## 2020-05-04 NOTE — Progress Notes (Signed)
Testosterone level is now low.  Confirm that he has been applying the 3 pumps daily of Axiron every 24 hours he will need to increase the dose to 2 pumps under each arm

## 2020-08-02 ENCOUNTER — Telehealth: Payer: Self-pay | Admitting: Endocrinology

## 2020-08-02 NOTE — Telephone Encounter (Signed)
Patient called to requests a new RX for Testosterone 30 MG/ACT SOLN be printed so that Patient can pick up RX from our office. Patient requests to be called at ph# [phone] when RX is ready for Patient to pick up

## 2020-08-03 MED ORDER — TESTOSTERONE 30 MG/ACT TD SOLN
TRANSDERMAL | 1 refills | Status: DC
Start: 2020-08-03 — End: 2020-08-04

## 2020-08-03 NOTE — Telephone Encounter (Signed)
I am having difficulty printing prescriptions, please print for me

## 2020-08-04 ENCOUNTER — Other Ambulatory Visit: Payer: Self-pay | Admitting: Endocrinology

## 2020-08-04 MED ORDER — TESTOSTERONE 30 MG/ACT TD SOLN
TRANSDERMAL | 1 refills | Status: DC
Start: 2020-08-04 — End: 2020-08-07

## 2020-08-05 ENCOUNTER — Other Ambulatory Visit: Payer: Self-pay | Admitting: Endocrinology

## 2020-08-05 DIAGNOSIS — E291 Testicular hypofunction: Secondary | ICD-10-CM

## 2020-08-07 ENCOUNTER — Other Ambulatory Visit: Payer: Self-pay

## 2020-08-07 ENCOUNTER — Other Ambulatory Visit: Payer: Self-pay | Admitting: *Deleted

## 2020-08-07 ENCOUNTER — Other Ambulatory Visit (INDEPENDENT_AMBULATORY_CARE_PROVIDER_SITE_OTHER): Payer: Self-pay

## 2020-08-07 DIAGNOSIS — E291 Testicular hypofunction: Secondary | ICD-10-CM

## 2020-08-07 LAB — CBC
HCT: 46.3 % (ref 39.0–52.0)
Hemoglobin: 15.6 g/dL (ref 13.0–17.0)
MCHC: 33.6 g/dL (ref 30.0–36.0)
MCV: 85.9 fl (ref 78.0–100.0)
Platelets: 222 10*3/uL (ref 150.0–400.0)
RBC: 5.4 Mil/uL (ref 4.22–5.81)
RDW: 14 % (ref 11.5–15.5)
WBC: 5.4 10*3/uL (ref 4.0–10.5)

## 2020-08-07 LAB — TESTOSTERONE: Testosterone: 163.14 ng/dL — ABNORMAL LOW (ref 300.00–890.00)

## 2020-08-07 MED ORDER — TESTOSTERONE 30 MG/ACT TD SOLN: TRANSDERMAL | 1 refills | Status: DC

## 2020-08-07 NOTE — Telephone Encounter (Signed)
Prescription was printed on Friday and given to CMA

## 2020-08-07 NOTE — Telephone Encounter (Signed)
I am unable to print rx's as well
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
## 2020-08-10 ENCOUNTER — Ambulatory Visit (INDEPENDENT_AMBULATORY_CARE_PROVIDER_SITE_OTHER): Payer: 59 | Admitting: Endocrinology

## 2020-08-10 ENCOUNTER — Encounter: Payer: Self-pay | Admitting: Endocrinology

## 2020-08-10 ENCOUNTER — Other Ambulatory Visit: Payer: Self-pay

## 2020-08-10 VITALS — BP 138/86 | HR 71 | Ht 74.0 in | Wt 281.2 lb

## 2020-08-10 DIAGNOSIS — E291 Testicular hypofunction: Secondary | ICD-10-CM

## 2020-08-10 MED ORDER — SILDENAFIL CITRATE 100 MG PO TABS: 100.0000 mg | ORAL_TABLET | Freq: Every day | ORAL | 2 refills | Status: DC | PRN

## 2020-09-04 ENCOUNTER — Telehealth (INDEPENDENT_AMBULATORY_CARE_PROVIDER_SITE_OTHER): Payer: 59 | Admitting: Family Medicine

## 2020-09-04 ENCOUNTER — Encounter: Payer: Self-pay | Admitting: Family Medicine

## 2020-09-04 DIAGNOSIS — J0100 Acute maxillary sinusitis, unspecified: Secondary | ICD-10-CM

## 2020-09-04 DIAGNOSIS — Z20822 Contact with and (suspected) exposure to covid-19: Secondary | ICD-10-CM

## 2020-09-04 MED ORDER — AMOXICILLIN 500 MG PO TABS: 500.0000 mg | ORAL_TABLET | Freq: Two times a day (BID) | ORAL | 0 refills | Status: AC

## 2020-09-04 MED ORDER — FLUTICASONE PROPIONATE 50 MCG/ACT NA SUSP
1.0000 | Freq: Every day | NASAL | 0 refills | Status: DC
Start: 2020-09-04 — End: 2020-09-29
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
## 2020-09-13 ENCOUNTER — Other Ambulatory Visit: Payer: Self-pay

## 2020-09-13 ENCOUNTER — Other Ambulatory Visit: Payer: 59

## 2020-09-26 ENCOUNTER — Other Ambulatory Visit: Payer: Self-pay | Admitting: Family Medicine

## 2020-09-26 DIAGNOSIS — J0100 Acute maxillary sinusitis, unspecified: Secondary | ICD-10-CM

## 2020-09-28 NOTE — Telephone Encounter (Signed)
Pt was seen on 09/04/2020 via video visit, pt does not have a PCP on file, ok to send refill for Flonase or have  pt establish with a provider in the office. Please advise

## 2020-12-06 ENCOUNTER — Other Ambulatory Visit: Payer: Self-pay

## 2020-12-06 ENCOUNTER — Encounter: Payer: Self-pay | Admitting: Family Medicine

## 2020-12-06 ENCOUNTER — Other Ambulatory Visit: Payer: Self-pay | Admitting: Family Medicine

## 2020-12-06 ENCOUNTER — Ambulatory Visit (INDEPENDENT_AMBULATORY_CARE_PROVIDER_SITE_OTHER): Payer: BC Managed Care – PPO | Admitting: Family Medicine

## 2020-12-06 VITALS — BP 124/90 | HR 90 | Temp 97.9°F | Wt 279.4 lb

## 2020-12-06 DIAGNOSIS — B351 Tinea unguium: Secondary | ICD-10-CM | POA: Insufficient documentation

## 2020-12-06 DIAGNOSIS — R200 Anesthesia of skin: Secondary | ICD-10-CM | POA: Diagnosis not present

## 2020-12-06 DIAGNOSIS — L84 Corns and callosities: Secondary | ICD-10-CM

## 2020-12-06 DIAGNOSIS — M79672 Pain in left foot: Secondary | ICD-10-CM | POA: Diagnosis not present

## 2020-12-06 DIAGNOSIS — B353 Tinea pedis: Secondary | ICD-10-CM

## 2020-12-06 DIAGNOSIS — M79671 Pain in right foot: Secondary | ICD-10-CM | POA: Diagnosis not present

## 2020-12-06 DIAGNOSIS — M722 Plantar fascial fibromatosis: Secondary | ICD-10-CM

## 2020-12-06 DIAGNOSIS — Z1211 Encounter for screening for malignant neoplasm of colon: Secondary | ICD-10-CM | POA: Diagnosis not present

## 2020-12-06 DIAGNOSIS — R202 Paresthesia of skin: Secondary | ICD-10-CM | POA: Diagnosis not present

## 2020-12-06 LAB — COMPREHENSIVE METABOLIC PANEL
ALT: 23 U/L (ref 0–53)
AST: 23 U/L (ref 0–37)
Albumin: 3.8 g/dL (ref 3.5–5.2)
Alkaline Phosphatase: 67 U/L (ref 39–117)
BUN: 13 mg/dL (ref 6–23)
CO2: 27 mEq/L (ref 19–32)
Calcium: 8.7 mg/dL (ref 8.4–10.5)
Chloride: 106 mEq/L (ref 96–112)
Creatinine, Ser: 1.18 mg/dL (ref 0.40–1.50)
GFR: 71.34 mL/min (ref >60.00)
Glucose, Bld: 81 mg/dL (ref 70–99)
Potassium: 4.1 mEq/L (ref 3.5–5.1)
Sodium: 140 mEq/L (ref 135–145)
Total Bilirubin: 0.3 mg/dL (ref 0.2–1.2)
Total Protein: 5.8 g/dL — ABNORMAL LOW (ref 6.0–8.3)

## 2020-12-06 LAB — CBC WITH DIFFERENTIAL/PLATELET
Basophils Absolute: 0.1 10*3/uL (ref 0.0–0.1)
Basophils Relative: 0.9 % (ref 0.0–3.0)
Eosinophils Absolute: 0.2 10*3/uL (ref 0.0–0.7)
Eosinophils Relative: 3.6 % (ref 0.0–5.0)
HCT: 47 % (ref 39.0–52.0)
Hemoglobin: 15.8 g/dL (ref 13.0–17.0)
Lymphocytes Relative: 26.5 % (ref 12.0–46.0)
Lymphs Abs: 1.8 10*3/uL (ref 0.7–4.0)
MCHC: 33.6 g/dL (ref 30.0–36.0)
MCV: 87 fl (ref 78.0–100.0)
Monocytes Absolute: 0.4 10*3/uL (ref 0.1–1.0)
Monocytes Relative: 6.5 % (ref 3.0–12.0)
Neutro Abs: 4.2 10*3/uL (ref 1.4–7.7)
Neutrophils Relative %: 62.5 % (ref 43.0–77.0)
Platelets: 227 10*3/uL (ref 150.0–400.0)
RBC: 5.4 Mil/uL (ref 4.22–5.81)
RDW: 14.8 % (ref 11.5–15.5)
WBC: 6.8 10*3/uL (ref 4.0–10.5)

## 2020-12-06 LAB — T4, FREE: Free T4: 0.78 ng/dL (ref 0.60–1.60)

## 2020-12-06 LAB — TSH: TSH: 1.89 u[IU]/mL (ref 0.35–4.50)

## 2020-12-06 LAB — FOLATE: Folate: 10.8 ng/mL (ref >5.9)

## 2020-12-06 LAB — VITAMIN B12: Vitamin B-12: 271 pg/mL (ref 211–911)

## 2020-12-06 MED ORDER — KETOCONAZOLE 2 % EX CREA: 1.0000 "application " | TOPICAL_CREAM | Freq: Every day | CUTANEOUS | 0 refills | Status: DC

## 2020-12-06 MED ORDER — CLOTRIMAZOLE 1 % EX CREA: 1.0000 "application " | TOPICAL_CREAM | Freq: Two times a day (BID) | CUTANEOUS | 1 refills | Status: DC

## 2020-12-06 NOTE — Telephone Encounter (Signed)
Please advise 
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
## 2020-12-06 NOTE — Patient Instructions (Signed)
Athlete's Foot  Athlete's foot (tinea pedis) is a fungal infection of the skin on your feet. It often occurs on the skin that is between or underneath your toes. It can also occur on the soles of your feet. Symptoms include itchy or white and flaky areas on the skin. The infection can spread from person to person (is contagious). It can also spread when a person's bare feet come in contact with the fungus on shower floors or on items such as shoes. Follow these instructions at home: Medicines  Apply or take over-the-counter and prescription medicines only as told by your doctor.  Apply your antifungal medicine as told by your doctor. Do not stop using the medicine even if your feet start to get better. Foot care  Do not scratch your feet.  Keep your feet dry: ? Wear cotton or wool socks. Change your socks every day or if they become wet. ? Wear shoes that allow air to move around, such as sandals or canvas tennis shoes.  Wash and dry your feet: ? Every day or as told by your doctor. ? After exercising. ? Including the area between your toes. General instructions  Do not share any of these items that touch your feet: ? Towels. ? Shoes. ? Nail clippers. ? Other personal items.  Protect your feet by wearing sandals in wet areas, such as locker rooms and shared showers.  Keep all follow-up visits as told by your doctor. This is important.  If you have diabetes, keep your blood sugar under control. Contact a doctor if:  You have a fever.  You have swelling, pain, warmth, or redness in your foot.  Your feet are not getting better with treatment.  Your symptoms get worse.  You have new symptoms. Summary  Athlete's foot is a fungal infection of the skin on your feet.  Symptoms include itchy or white and flaky areas on the skin.  Apply your antifungal medicine as told by your doctor.  Keep your feet clean and dry. This information is not intended to replace advice given  to you by your health care provider. Make sure you discuss any questions you have with your health care provider. Document Revised: 03/30/2020 Document Reviewed: 03/30/2020 Elsevier Patient Education  2021 [employer].  https://www.cancer.org/cancer/colon-rectal-cancer/causes-risks-prevention/risk-factors.html">  Colorectal Cancer Screening  Colorectal cancer screening is a group of tests that are used to check for colorectal cancer before symptoms develop. Colorectal refers to the colon and rectum. The colon and rectum are located at the end of the digestive tract and carry stool (feces) out of the body. Who should have screening? All adults who are 60-28 years old should have screening. Your health care provider may recommend screening before age 6. You will have tests every 1-10 years, depending on your results and the type of screening test. Screening recommendations for adults who are 71-10 years old vary depending on a person's health. People older than age 9 should no longer get colorectal cancer screening. You may have screening tests starting before age 61, or more often than other people, if you have any of these risk factors:  A personal or family history of colorectal cancer or abnormal growths known as polyps in your colon.  Inflammatory bowel disease, such as ulcerative colitis or Crohn's disease.  A history of having radiation treatment to the abdomen or the area between the hip bones (pelvic area) for cancer.  A type of genetic syndrome that is passed from parent to child (hereditary), such  as: ? Garin syndrome. ? Familial adenomatous polyposis. ? Turcot syndrome. ? Peutz-Jeghers syndrome. ? MUTYH-associated polyposis (MAP).  A personal history of diabetes. Types of tests There are several types of colorectal screening tests. You may have one or more of the following:  Guaiac-based fecal occult blood testing. For this test, a stool sample is checked for hidden  (occult) blood, which could be a sign of colorectal cancer.  Fecal immunochemical test (FIT). For this test, a stool sample is checked for blood, which could be a sign of colorectal cancer.  Stool DNA test. For this test, a stool sample is checked for blood and changes in DNA that could lead to colorectal cancer.  Sigmoidoscopy. During this test, a thin, flexible tube with a camera on the end, called a sigmoidoscope, is used to examine the rectum and the lower colon.  Colonoscopy. During this test, a long, flexible tube with a camera on the end, called a colonoscope, is used to examine the entire colon and rectum. Also, sometimes a tissue sample is taken to be looked at under a microscope (biopsy) or small polyps are removed during this test.  Virtual colonoscopy. Instead of a colonoscope, this type of colonoscopy uses a CT scan to take pictures of the colon and rectum. A CT scan is a type of X-ray that is made using computers. What are the benefits of screening? Screening reduces your risk for colorectal cancer and can help identify cancer at an early stage, when the cancer can be removed or treated more easily. It is common for polyps to form in the lining of the colon, especially as you age. These polyps may be cancerous or become cancerous over time. Screening can identify these polyps. What are the risks of screening? Generally, these are safe tests. However, problems may occur, including:  The need for more tests to confirm results from a stool sample test. Stool sample tests have fewer risks than other types of screening tests.  Being exposed to low levels of radiation, if you had a test involving X-rays. This may slightly increase your cancer risk. The benefit of detecting cancer outweighs the slight increase in risk.  Bleeding, damage to the intestine, or infection caused by a sigmoidoscopy or colonoscopy.  A reaction to medicines given during a sigmoidoscopy or colonoscopy. Talk with  your health care provider to understand your risk for colorectal cancer and to make a screening plan that is right for you. Questions to ask your health care provider  When should I start colorectal cancer screening?  What is my risk for colorectal cancer?  How often do I need screening?  Which screening tests do I need?  How do I get my test results?  What do my results mean? Where to find more information Learn more about colorectal cancer screening from:  The American Cancer Society: cancer.org  National Cancer Institute: cancer.gov Summary  Colorectal cancer screening is a group of tests used to check for colorectal cancer before symptoms develop.  All adults who are 14-58 years old should have screening. Your health care provider may recommend screening before age 49.  You may have screening tests starting before age 93, or more often than other people, if you have certain risk factors.  Screening reduces your risk for colorectal cancer and can help identify cancer at an early stage, when the cancer can be removed or treated more easily.  Talk with your health care provider to understand your risk for colorectal cancer and to  make a screening plan that is right for you. This information is not intended to replace advice given to you by your health care provider. Make sure you discuss any questions you have with your health care provider. Document Revised: 12/01/2019 Document Reviewed: 12/01/2019 Elsevier Patient Education  2021 [employer] Inc.  Foot Pain Many things can cause foot pain. Some common causes are:  An injury.  A sprain.  Arthritis.  Blisters.  Bunions. Follow these instructions at home: Managing pain, stiffness, and swelling If directed, put ice on the painful area:  Put ice in a plastic bag.  Place a towel between your skin and the bag.  Leave the ice on for 20 minutes, 2-3 times a day.   Activity  Do not stand or walk for long  periods.  Return to your normal activities as told by your health care provider. Ask your health care provider what activities are safe for you.  Do stretches to relieve foot pain and stiffness as told by your health care provider.  Do not lift anything that is heavier than 10 lb (4.5 kg), or the limit that you are told, until your health care provider says that it is safe. Lifting a lot of weight can put added pressure on your feet. Lifestyle  Wear comfortable, supportive shoes that fit you well. Do not wear high heels.  Keep your feet clean and dry. General instructions  Take over-the-counter and prescription medicines only as told by your health care provider.  Rub your foot gently.  Pay attention to any changes in your symptoms.  Keep all follow-up visits as told by your health care provider. This is important. Contact a health care provider if:  Your pain does not get better after a few days of self-care.  Your pain gets worse.  You cannot stand on your foot. Get help right away if:  Your foot is numb or tingling.  Your foot or toes are swollen.  Your foot or toes turn white or blue.  You have warmth and redness along your foot. Summary  Common causes of foot pain are injury, sprain, arthritis, blisters or bunions.  Ice, medicines, and comfortable shoes may help foot pain.  Contact your health care provider if your pain does not get better after a few days of self-care. This information is not intended to replace advice given to you by your health care provider. Make sure you discuss any questions you have with your health care provider. Document Revised: 05/28/2018 Document Reviewed: 05/28/2018 Elsevier Patient Education  2021 Else[employer] Inc.  Fungal Nail Infection A fungal nail infection is a common infection of the toenails or fingernails. This condition affects toenails more often than fingernails. It often affects the great, or big, toes. More than one nail  may be infected. The condition can be passed from person to person (is contagious). What are the causes? This condition is caused by a fungus. Several types of fungi can cause the infection. These fungi are common in moist and warm areas. If your hands or feet come into contact with the fungus, it may get into a crack in your fingernail or toenail and cause the infection. What increases the risk? The following factors may make you more likely to develop this condition:  Being male.  Being of older age.  Living with someone who has the fungus.  Walking barefoot in areas where the fungus thrives, such as showers or locker rooms.  Wearing shoes and socks that cause your feet  to sweat.  Having a nail injury or a recent nail surgery.  Having certain medical conditions, such as: ? Athlete's foot. ? Diabetes. ? Psoriasis. ? Poor circulation. ? A weak body defense system (immune system). What are the signs or symptoms? Symptoms of this condition include:  A pale spot on the nail.  Thickening of the nail.  A nail that becomes yellow or brown.  A brittle or ragged nail edge.  A crumbling nail.  A nail that has lifted away from the nail bed.   How is this diagnosed? This condition is diagnosed with a physical exam. Your health care provider may take a scraping or clipping from your nail to test for the fungus. How is this treated? Treatment is not needed for mild infections. If you have significant nail changes, treatment may include:  Antifungal medicines taken by mouth (orally). You may need to take the medicine for several weeks or several months, and you may not see the results for a long time. These medicines can cause side effects. Ask your health care provider what problems to watch for.  Antifungal nail polish or nail cream. These may be used along with oral antifungal medicines.  Laser treatment of the nail.  Surgery to remove the nail. This may be needed for the most  severe infections. It can take a long time, usually up to a year, for the infection to go away. The infection may also come back.   Follow these instructions at home: Medicines  Take or apply over-the-counter and prescription medicines only as told by your health care provider.  Ask your health care provider about using over-the-counter mentholated ointment on your nails. Nail care  Trim your nails often.  Wash and dry your hands and feet every day.  Keep your feet dry: ? Wear absorbent socks, and change your socks frequently. ? Wear shoes that allow air to circulate, such as sandals or canvas tennis shoes. Throw out old shoes.  Do not use artificial nails.  If you go to a nail salon, make sure you choose one that uses clean instruments.  Use antifungal foot powder on your feet and in your shoes. General instructions  Do not share personal items, such as towels or nail clippers.  Do not walk barefoot in shower rooms or locker rooms.  Wear rubber gloves if you are working with your hands in wet areas.  Keep all follow-up visits as told by your health care provider. This is important. Contact a health care provider if: Your infection is not getting better or it is getting worse after several months. Summary  A fungal nail infection is a common infection of the toenails or fingernails.  Treatment is not needed for mild infections. If you have significant nail changes, treatment may include taking medicine orally and applying medicine to your nails.  It can take a long time, usually up to a year, for the infection to go away. The infection may also come back.  Take or apply over-the-counter and prescription medicines only as told by your health care provider.  Follow instructions for taking care of your nails to help prevent infection from coming back or spreading. This information is not intended to replace advice given to you by your health care provider. Make sure you  discuss any questions you have with your health care provider. Document Revised: 12/03/2018 Document Reviewed: 01/16/2018 Elsevier Patient Education  2021 [employer] Inc[employer]  Plantar Fasciitis  Plantar fasciitis is a painful foot condition  that affects the heel. It occurs when the band of tissue that connects the toes to the heel bone (plantar fascia) becomes irritated. This can happen as the result of exercising too much or doing other repetitive activities (overuse injury). Plantar fasciitis can cause mild irritation to severe pain that makes it difficult to walk or move. The pain is usually worse in the morning after sleeping, or after sitting or lying down for a period of time. Pain may also be worse after long periods of walking or standing. What are the causes? This condition may be caused by:  Standing for long periods of time.  Wearing shoes that do not have good arch support.  Doing activities that put stress on joints (high-impact activities). This includes ballet and exercise that makes your heart beat faster (aerobic exercise), such as running.  Being overweight.  An abnormal way of walking (gait).  Tight muscles in the back of your lower leg (calf).  High arches in your feet or flat feet.  Starting a new athletic activity. What are the signs or symptoms? The main symptom of this condition is heel pain. Pain may get worse after the following:  Taking the first steps after a time of rest, especially in the morning after awakening, or after you have been sitting or lying down for a while.  Long periods of standing still. Pain may decrease after 30-45 minutes of activity, such as gentle walking. How is this diagnosed? This condition may be diagnosed based on your medical history, a physical exam, and your symptoms. Your health care provider will check for:  A tender area on the bottom of your foot.  A high arch in your foot or flat feet.  Pain when you move your  foot.  Difficulty moving your foot. You may have imaging tests to confirm the diagnosis, such as:  X-rays.  Ultrasound.  MRI. How is this treated? Treatment for plantar fasciitis depends on how severe your condition is. Treatment may include:  Rest, ice, pressure (compression), and raising (elevating) the affected foot. This is called RICE therapy. Your health care provider may recommend RICE therapy along with over-the-counter pain medicines to manage your pain.  Exercises to stretch your calves and your plantar fascia.  A splint that holds your foot in a stretched, upward position while you sleep (night splint).  Physical therapy to relieve symptoms and prevent problems in the future.  Injections of steroid medicine (cortisone) to relieve pain and inflammation.  Stimulating your plantar fascia with electrical impulses (extracorporeal shock wave therapy). This is usually the last treatment option before surgery.  Surgery, if other treatments have not worked after 12 months. Follow these instructions at home: Managing pain, stiffness, and swelling  If directed, put ice on the painful area. To do this: ? Put ice in a plastic bag, or use a frozen bottle of water. ? Place a towel between your skin and the bag or bottle. ? Roll the bottom of your foot over the bag or bottle. ? Do this for 20 minutes, 2-3 times a day.  Wear athletic shoes that have air-sole or gel-sole cushions, or try soft shoe inserts that are designed for plantar fasciitis.  Elevate your foot above the level of your heart while you are sitting or lying down.   Activity  Avoid activities that cause pain. Ask your health care provider what activities are safe for you.  Do physical therapy exercises and stretches as told by your health care provider.  Try activities and forms of exercise that are easier on your joints (low impact). Examples include swimming, water aerobics, and biking. General  instructions  Take over-the-counter and prescription medicines only as told by your health care provider.  Wear a night splint while sleeping, if told by your health care provider. Loosen the splint if your toes tingle, become numb, or turn cold and blue.  Maintain a healthy weight, or work with your health care provider to lose weight as needed.  Keep all follow-up visits. This is important. Contact a health care provider if you have:  Symptoms that do not go away with home treatment.  Pain that gets worse.  Pain that affects your ability to move or do daily activities. Summary  Plantar fasciitis is a painful foot condition that affects the heel. It occurs when the band of tissue that connects the toes to the heel bone (plantar fascia) becomes irritated.  Heel pain is the main symptom of this condition. It may get worse after exercising too much or standing still for a long time.  Treatment varies, but it usually starts with rest, ice, pressure (compression), and raising (elevating) the affected foot. This is called RICE therapy. Over-the-counter medicines can also be used to manage pain. This information is not intended to replace advice given to you by your health care provider. Make sure you discuss any questions you have with your health care provider. Document Revised: 11/29/2019 Document Reviewed: 11/29/2019 Elsevier Patient Education  2021 [employer]  Plantar Fasciitis Rehab Ask your health care provider which exercises are safe for you. Do exercises exactly as told by your health care provider and adjust them as directed. It is normal to feel mild stretching, pulling, tightness, or discomfort as you do these exercises. Stop right away if you feel sudden pain or your pain gets worse. Do not begin these exercises until told by your health care provider. Stretching and range-of-motion exercises These exercises warm up your muscles and joints and improve the movement and  flexibility of your foot. These exercises also help to relieve pain. Plantar fascia stretch 1. Sit with your left / right leg crossed over your opposite knee. 2. Hold your heel with one hand with that thumb near your arch. With your other hand, hold your toes and gently pull them back toward the top of your foot. You should feel a stretch on the base (bottom) of your toes, or the bottom of your foot (plantar fascia), or both. 3. Hold this stretch for__________ seconds. 4. Slowly release your toes and return to the starting position. Repeat __________ times. Complete this exercise __________ times a day.   Gastrocnemius stretch, standing This exercise is also called a calf (gastroc) stretch. It stretches the muscles in the back of the upper calf. 1. Stand with your hands against a wall. 2. Extend your left / right leg behind you, and bend your front knee slightly. 3. Keeping your heels on the floor, your toes facing forward, and your back knee straight, shift your weight toward the wall. Do not arch your back. You should feel a gentle stretch in your upper calf. 4. Hold this position for __________ seconds. Repeat __________ times. Complete this exercise __________ times a day.   Soleus stretch, standing This exercise is also called a calf (soleus) stretch. It stretches the muscles in the back of the lower calf. 1. Stand with your hands against a wall. 2. Extend your left / right leg behind you, and bend your  front knee slightly. 3. Keeping your heels on the floor and your toes facing forward, bend your back knee and shift your weight slightly over your back leg. You should feel a gentle stretch deep in your lower calf. 4. Hold this position for __________ seconds. Repeat __________ times. Complete this exercise __________ times a day. Gastroc and soleus stretch, standing step This exercise stretches the muscles in the back of the lower leg. These muscles are in the upper calf (gastrocnemius)  and the lower calf (soleus). 1. Stand with the ball of your left / right foot on the front of a step. The ball of your foot is on the walking surface, right under your toes. 2. Keep your other foot firmly on the same step. 3. Hold on to the wall or a railing for balance. 4. Slowly lift your other foot, allowing your body weight to press your heel down over the edge of the front of the step. Keep knee straight and unbent. You should feel a stretch in your calf. 5. Hold this position for __________ seconds. 6. Return both feet to the step. 7. Repeat this exercise with a slight bend in your left / right knee. Repeat __________ times with your left / right knee straight and __________ times with your left / right knee bent. Complete this exercise __________ times a day. Balance exercise This exercise builds your balance and strength control of your arch to help take pressure off your plantar fascia. Single leg stand If this exercise is too easy, you can try it with your eyes closed or while standing on a pillow. 1. Without shoes, stand near a railing or in a doorway. You may hold on to the railing or door frame as needed. 2. Stand on your left / right foot. Keep your big toe down on the floor and lift the arch of your foot. You should feel a stretch across the bottom of your foot and your arch. Do not let your foot roll inward. 3. Hold this position for __________ seconds. Repeat __________ times. Complete this exercise __________ times a day. This information is not intended to replace advice given to you by your health care provider. Make sure you discuss any questions you have with your health care provider. Document Revised: 05/25/2020 Document Reviewed: 05/25/2020 Elsevier Patient Education  2021 [employer].

## 2020-12-07 NOTE — Progress Notes (Signed)
Results viewed on MyChart. 

## 2020-12-12 ENCOUNTER — Other Ambulatory Visit (INDEPENDENT_AMBULATORY_CARE_PROVIDER_SITE_OTHER): Payer: BC Managed Care – PPO

## 2020-12-12 ENCOUNTER — Ambulatory Visit (INDEPENDENT_AMBULATORY_CARE_PROVIDER_SITE_OTHER): Payer: BC Managed Care – PPO | Admitting: Podiatrist

## 2020-12-12 ENCOUNTER — Other Ambulatory Visit: Payer: Self-pay

## 2020-12-12 ENCOUNTER — Ambulatory Visit (INDEPENDENT_AMBULATORY_CARE_PROVIDER_SITE_OTHER): Payer: BC Managed Care – PPO

## 2020-12-12 ENCOUNTER — Encounter: Payer: Self-pay | Admitting: Podiatrist

## 2020-12-12 DIAGNOSIS — M79672 Pain in left foot: Secondary | ICD-10-CM

## 2020-12-12 DIAGNOSIS — G5762 Lesion of plantar nerve, left lower limb: Secondary | ICD-10-CM | POA: Diagnosis not present

## 2020-12-12 DIAGNOSIS — M79671 Pain in right foot: Secondary | ICD-10-CM | POA: Diagnosis not present

## 2020-12-12 DIAGNOSIS — E291 Testicular hypofunction: Secondary | ICD-10-CM

## 2020-12-12 DIAGNOSIS — L601 Onycholysis: Secondary | ICD-10-CM | POA: Diagnosis not present

## 2020-12-12 DIAGNOSIS — M722 Plantar fascial fibromatosis: Secondary | ICD-10-CM | POA: Diagnosis not present

## 2020-12-12 DIAGNOSIS — L603 Nail dystrophy: Secondary | ICD-10-CM | POA: Diagnosis not present

## 2020-12-12 DIAGNOSIS — B351 Tinea unguium: Secondary | ICD-10-CM

## 2020-12-12 LAB — TESTOSTERONE: Testosterone: 193.91 ng/dL — ABNORMAL LOW (ref 300.00–890.00)

## 2020-12-12 MED ORDER — METHYLPREDNISOLONE 4 MG PO TBPK: ORAL_TABLET | ORAL | 1 refills | Status: DC

## 2020-12-12 NOTE — Patient Instructions (Signed)
[employer] and [employer] are great for shoes and inserts  Plantar Fasciitis (Heel Spur Syndrome) with Rehab The plantar fascia is a fibrous, ligament-like, soft-tissue structure that spans the bottom of the foot. Plantar fasciitis is a condition that causes pain in the foot due to inflammation of the tissue. SYMPTOMS   Pain and tenderness on the underneath side of the foot.  Pain that worsens with standing or walking. CAUSES  Plantar fasciitis is caused by irritation and injury to the plantar fascia on the underneath side of the foot. Common mechanisms of injury include:  Direct trauma to bottom of the foot.  Damage to a small nerve that runs under the foot where the main fascia attaches to the heel bone. Stress placed on the plantar fascia due to any mild increased activity or injury RISK INCREASES WITH:   Obesity.  Poor strength and flexibility.  Improperly fitted shoes.  Tight calf muscles.  Flat feet.  Failure to warm-up properly before activity.  PREVENTION  Warm up and stretch properly before activity.  Strength, flexibility  Maintain a health body weight.  Avoid stress on the plantar fascia.  Wear properly fitted shoes, including arch supports for individuals who have flat feet. PROGNOSIS  If treated properly, then the symptoms of plantar fasciitis usually resolve without surgery. However, occasionally surgery is necessary. RELATED COMPLICATIONS   Recurrent symptoms that may result in a chronic condition.  Problems of the lower back that are caused by compensating for the injury, such as limping.  Pain or weakness of the foot during push-off following surgery.  Chronic inflammation, scarring, and partial or complete fascia tear, occurring more often from repeated injections. TREATMENT  Treatment initially involves the use of ice and medication to help reduce pain and inflammation. The use of strengthening and stretching exercises may help reduce pain  with activity, especially stretches of the Achilles tendon.  Your caregiver may recommend that you use arch supports to help reduce stress on the plantar fascia. Often, corticosteroid injections are given to reduce inflammation. If symptoms persist for greater than 6 months despite non-surgical (conservative), then surgery may be recommended.  MEDICATION   If pain medication is necessary, then nonsteroidal anti-inflammatory medications, such as aspirin and ibuprofen, or other minor pain relievers, such as acetaminophen, are often recommended. Corticosteroid injections may be given by your caregiver.  HEAT AND COLD  Cold treatment (icing) relieves pain and reduces inflammation. Cold treatment should be applied for 10 to 15 minutes every 2 to 3 hours for inflammation and pain and immediately after any activity that aggravates your symptoms. Use ice packs or massage the area with a piece of ice (ice massage).  Heat treatment may be used prior to performing the stretching and strengthening activities prescribed by your caregiver, physical therapist, or athletic trainer. Use a heat pack or soak the injury in warm water. SEEK IMMEDIATE MEDICAL CARE IF:  Treatment seems to offer no benefit, or the condition worsens.  Any medications produce adverse side effects.  Perform this particular stretch daily first thing in the morning and before you go to bed. Hold for 30 seconds.    Try all the exercises and choose your favorite 3 to perform daily--  EXERCISES-- perform each exercise a total of 10-15 repetitions.  Hold for 30 seconds and perform 3 times per day   RANGE OF MOTION (ROM) AND STRETCHING EXERCISES - Plantar Fasciitis (Heel Spur Syndrome) These exercises may help you when beginning to rehabilitate your injury.  While completing these exercises, remember:   Restoring tissue flexibility helps normal motion to return to the joints. This allows healthier, less painful movement and  activity.  An effective stretch should be held for at least 30 seconds.  A stretch should never be painful. You should only feel a gentle lengthening or release in the stretched tissue. RANGE OF MOTION - Toe Extension, Flexion  Sit with your right / left leg crossed over your opposite knee.  Grasp your toes and gently pull them back toward the top of your foot. You should feel a stretch on the bottom of your toes and/or foot.  Hold this stretch for __________ seconds.  Now, gently pull your toes toward the bottom of your foot. You should feel a stretch on the top of your toes and or foot.  Hold this stretch for __________ seconds. Repeat __________ times. Complete this stretch __________ times per day.  RANGE OF MOTION - Ankle Dorsiflexion, Active Assisted  Remove shoes and sit on a chair that is preferably not on a carpeted surface.  Place right / left foot under knee. Extend your opposite leg for support.  Keeping your heel down, slide your right / left foot back toward the chair until you feel a stretch at your ankle or calf. If you do not feel a stretch, slide your bottom forward to the edge of the chair, while still keeping your heel down.  Hold this stretch for __________ seconds. Repeat __________ times. Complete this stretch __________ times per day.  STRETCH  Gastroc, Standing  Place hands on wall.  Extend right / left leg, keeping the front knee somewhat bent.  Slightly point your toes inward on your back foot.  Keeping your right / left heel on the floor and your knee straight, shift your weight toward the wall, not allowing your back to arch.  You should feel a gentle stretch in the right / left calf. Hold this position for __________ seconds. Repeat __________ times. Complete this stretch __________ times per day. STRETCH  Soleus, Standing  Place hands on wall.  Extend right / left leg, keeping the other knee somewhat bent.  Slightly point your toes inward on  your back foot.  Keep your right / left heel on the floor, bend your back knee, and slightly shift your weight over the back leg so that you feel a gentle stretch deep in your back calf.  Hold this position for __________ seconds. Repeat __________ times. Complete this stretch __________ times per day. STRETCH  Gastrocsoleus, Standing  Note: This exercise can place a lot of stress on your foot and ankle. Please complete this exercise only if specifically instructed by your caregiver.   Place the ball of your right / left foot on a step, keeping your other foot firmly on the same step.  Hold on to the wall or a rail for balance.  Slowly lift your other foot, allowing your body weight to press your heel down over the edge of the step.  You should feel a stretch in your right / left calf.  Hold this position for __________ seconds.  Repeat this exercise with a slight bend in your right / left knee. Repeat __________ times. Complete this stretch __________ times per day.  STRENGTHENING EXERCISES - Plantar Fasciitis (Heel Spur Syndrome)  These exercises may help you when beginning to rehabilitate your injury. They may resolve your symptoms with or without further involvement from your physician, physical therapist or athletic trainer. While  completing these exercises, remember:   Muscles can gain both the endurance and the strength needed for everyday activities through controlled exercises.  Complete these exercises as instructed by your physician, physical therapist or athletic trainer. Progress the resistance and repetitions only as guided.

## 2020-12-12 NOTE — Progress Notes (Signed)
Chief Complaint  Patient presents with  . Foot Pain    Bilateral foot pain x 1+ year. Arch pain radiating to ball of foot. Pt states he feels a knot at the bottom of his left foot between the 2nd and 4th toe.      HPI: Patient is 50 y.o. male who presents today for the concerns as listed above.  He relates pain in the right arch and ball of the foot and describes it as a throbbing and tingling pain.  He also states he has pain in the ball of the left foot between the second and fourth toe as well as pain at the left great toe.  States all this discomfort is been occurring for about a year.  He denies any treatment.  Patient Active Problem List   Diagnosis Date Noted  . Onychomycosis 12/06/2020  . 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 Abnormal nuclear stress test 12/09/2017  . Hyperlipidemia 02/28/2017  . BMI 36.0-36.9,adult 10/03/2016  . Depression 05/10/2015  . Obstructive sleep apnea- possible 12/19/2013  . Hypogonadism male 10/28/2011    Current Outpatient Medications on File Prior to Visit  Medication Sig Dispense Refill  . fluticasone (FLONASE) 50 MCG/ACT nasal spray SPRAY 1 SPRAY INTO BOTH NOSTRILS DAILY. 16 mL 3  . ketoconazole (NIZORAL) 2 % cream Apply 1 application topically daily. 60 g 0  . sildenafil (VIAGRA) 100 MG tablet Take 1 tablet (100 mg total) by mouth daily as needed for erectile dysfunction. 15 tablet 2  . Testosterone 30 MG/ACT SOLN APPLY 2 PUMPS under 1 arm and 1 pump under the other arm daily (Patient taking differently: APPLY I pump under each arm daily) 540 mL 1   No current facility-administered medications on file prior to visit.    No Known Allergies  Review of Systems No fevers, chills, nausea, muscle aches, no difficulty breathing, no calf pain, no chest pain or shortness of breath.   Physical Exam  GENERAL APPEARANCE: Alert, conversant. Appropriately groomed. No acute distress.   VASCULAR: Pedal pulses palpable DP and PT bilateral.  Capillary  refill time is immediate to all digits,  Proximal to distal cooling it warm to warm.  Digital perfusion adequate.   NEUROLOGIC: sensation is intact to 5.07 monofilament at 5/5 sites bilateral.  Light touch is intact bilateral, vibratory sensation intact bilateral  MUSCULOSKELETAL: acceptable muscle strength, tone and stability bilateral.  No gross boney pedal deformities noted.  Plantar medial aspect of the right heel is painful consistent with plantar fasciitis symptomatology.  Pain at the area of the third interspace of the left foot is noted consistent with a neuroma.  DERMATOLOGIC: skin is warm, supple, and dry.  No open lesions noted.  No rash, no pre ulcerative lesions.  Left hallux nail is thickened, discolored, painful with pressure and consistent with either mycotic nail infection versus trauma.  X-ray evaluation 3 views of bilateral feet are obtained.  On the left foot there is an inferior heel spur present.  Contracture of digits 2 and 4 is also noted.  No acute osseous abnormalities are seen.  On the right foot contracture of digits 2 and 4 is noted as well.  Elongated posterior talar process is also seen with a posterior heel spur noted.  No osseous abnormalities noted in the area of pain.  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 Trauma   Plan  #1.  For the neuroma I recommended an injection which  will be a therapeutic and diagnostic injection and the patient agreed to prepped the skin with alcohol and an injection consisting of Kenalog and Marcaine plain was infiltrated into the third interspace of the left foot without complication.  The patient will pay attention to see if the injection was beneficial even if for short time.  Discussed alcohol sclerosing injections could be a potential treatment in the future should the steroid injection not be beneficial for long-term. #2 for the plantar fasciitis of the right foot I also  recommended an injection.  The patient agreed and a sterile skin prep was performed.  Kenalog and Marcaine mixture was infiltrated into the plantar medial aspect of the right foot at the insertion of the plantar fascial.  The patient tolerated this well.  He was given instructions for aftercare.  Rx for a medrol dose pack was also dispensed for his use if he needs it.  #3.  A night splint was dispensed for his use to use on the right heel at night.   #4.  A sample of the left hallux nail was taken and will be sent to [facility] for analysis.  We will notify him of the result when it is available and give treatment recommendations at that time.   #5.  The patient will be back in 1 month for follow-up and will call if any concerns arise prior to that visit.
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
## 2020-12-14 ENCOUNTER — Ambulatory Visit (INDEPENDENT_AMBULATORY_CARE_PROVIDER_SITE_OTHER): Payer: BC Managed Care – PPO | Admitting: Endocrinology

## 2020-12-14 ENCOUNTER — Other Ambulatory Visit: Payer: Self-pay

## 2020-12-14 ENCOUNTER — Encounter: Payer: Self-pay | Admitting: Endocrinology

## 2020-12-14 VITALS — BP 136/82 | HR 75 | Ht 74.0 in | Wt 279.6 lb

## 2020-12-14 DIAGNOSIS — E291 Testicular hypofunction: Secondary | ICD-10-CM | POA: Diagnosis not present

## 2020-12-14 MED ORDER — JATENZO 158 MG PO CAPS: 1.0000 | ORAL_CAPSULE | Freq: Two times a day (BID) | ORAL | 2 refills | Status: DC

## 2020-12-15 ENCOUNTER — Telehealth: Payer: Self-pay | Admitting: *Deleted

## 2020-12-15 NOTE — Telephone Encounter (Signed)
PA has been submitted.

## 2020-12-15 NOTE — Telephone Encounter (Signed)
Yes, failed Androgel and Axiron

## 2020-12-27 ENCOUNTER — Encounter: Payer: Self-pay | Admitting: Podiatrist

## 2021-01-10 ENCOUNTER — Other Ambulatory Visit: Payer: Self-pay | Admitting: Podiatrist

## 2021-01-10 ENCOUNTER — Telehealth: Payer: Self-pay | Admitting: Podiatrist

## 2021-01-10 DIAGNOSIS — B351 Tinea unguium: Secondary | ICD-10-CM

## 2021-01-10 DIAGNOSIS — Z79899 Other long term (current) drug therapy: Secondary | ICD-10-CM

## 2021-01-10 MED ORDER — TERBINAFINE HCL 250 MG PO TABS: 250.0000 mg | ORAL_TABLET | Freq: Every day | ORAL | 0 refills | Status: DC

## 2021-01-10 NOTE — Telephone Encounter (Signed)
Called and left a message that his nail sample is positive for fungus and most effective treatment is Lamisil.  I checked his labs from April 2022 and ALT and AST are normal.   Lamisil was called in today to his pharmacy I also stated he will need a repeat liver function test 1 month after being on the Lamisil.  Order entered and paperwork will be mailed to the patient.  He may opt to have the labs done at his primary care physicians office instead if he prefers.

## 2021-01-12 ENCOUNTER — Telehealth: Payer: Self-pay | Admitting: Endocrinology

## 2021-01-12 NOTE — Telephone Encounter (Signed)
Message left for patient to return my call.  

## 2021-01-12 NOTE — Telephone Encounter (Signed)
Please advise 

## 2021-01-12 NOTE — Telephone Encounter (Signed)
He will need to to use 2 pumps of Axiron under 1 arm and 3 under the other. Does he need new Rx?

## 2021-01-12 NOTE — Telephone Encounter (Signed)
Patient called to advise that the Testosterone Undecanoate [facility]) is expensive even with the discount card.  Would like to go back to what he was taking before.  Call back # [phone]

## 2021-01-15 NOTE — Telephone Encounter (Signed)
Patient advised that will need to use 2 pumps of Axiron under 1 arm and 3 under the other. Verbalized understanding.  He does need a new RX. Would like to pick up a paper prescription so that he can shop the price through [facility], pharmacies etc.  Patient advised that we will send a note back for a paper RX request and that we will call him once that paper RX is ready for pick up.  Patient advised that if he does not answer to please leave him a VM (he works 3rd shift)

## 2021-01-16 ENCOUNTER — Other Ambulatory Visit: Payer: Self-pay | Admitting: Endocrinology

## 2021-01-16 MED ORDER — TESTOSTERONE 30 MG/ACT TD SOLN: TRANSDERMAL | 3 refills | Status: DC

## 2021-01-16 NOTE — Telephone Encounter (Signed)
Called pt at 11:24pm on 01/16/2021 to notify patient

## 2021-01-16 NOTE — Telephone Encounter (Signed)
Dr Lucianne Muss,  Could you print a new Rx for patient? I will call him when I get back to [facility] office on Wednesday.

## 2021-01-16 NOTE — Telephone Encounter (Signed)
Prescription has been signed, please notify patient to pick up

## 2021-01-17 NOTE — Telephone Encounter (Signed)
Patient picked up paper RX at 1:25 PM 01/17/21

## 2021-02-13 ENCOUNTER — Ambulatory Visit: Payer: BC Managed Care – PPO | Admitting: Endocrinology

## 2021-02-13 ENCOUNTER — Other Ambulatory Visit: Payer: Self-pay

## 2021-02-13 ENCOUNTER — Other Ambulatory Visit (INDEPENDENT_AMBULATORY_CARE_PROVIDER_SITE_OTHER): Payer: BC Managed Care – PPO

## 2021-02-13 DIAGNOSIS — E291 Testicular hypofunction: Secondary | ICD-10-CM

## 2021-02-13 LAB — TESTOSTERONE: Testosterone: 487.68 ng/dL (ref 300.00–890.00)

## 2021-02-15 ENCOUNTER — Ambulatory Visit (INDEPENDENT_AMBULATORY_CARE_PROVIDER_SITE_OTHER): Payer: BC Managed Care – PPO | Admitting: Endocrinology

## 2021-02-15 ENCOUNTER — Other Ambulatory Visit: Payer: Self-pay

## 2021-02-15 VITALS — BP 120/82 | HR 73 | Ht 74.0 in | Wt 277.6 lb

## 2021-02-15 DIAGNOSIS — E291 Testicular hypofunction: Secondary | ICD-10-CM

## 2021-02-15 MED ORDER — TESTOSTERONE 30 MG/ACT TD SOLN: TRANSDERMAL | 3 refills | Status: DC

## 2021-02-15 MED ORDER — SILDENAFIL CITRATE 100 MG PO TABS: 100.0000 mg | ORAL_TABLET | Freq: Every day | ORAL | 2 refills | Status: DC | PRN
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
## 2021-02-28 ENCOUNTER — Other Ambulatory Visit: Payer: Self-pay

## 2021-02-28 ENCOUNTER — Ambulatory Visit (INDEPENDENT_AMBULATORY_CARE_PROVIDER_SITE_OTHER): Payer: BC Managed Care – PPO | Admitting: Podiatrist

## 2021-02-28 DIAGNOSIS — M722 Plantar fascial fibromatosis: Secondary | ICD-10-CM

## 2021-02-28 DIAGNOSIS — B351 Tinea unguium: Secondary | ICD-10-CM | POA: Diagnosis not present

## 2021-02-28 DIAGNOSIS — G5761 Lesion of plantar nerve, right lower limb: Secondary | ICD-10-CM

## 2021-02-28 MED ORDER — TERBINAFINE HCL 250 MG PO TABS: 250.0000 mg | ORAL_TABLET | Freq: Every day | ORAL | 0 refills | Status: DC

## 2021-02-28 NOTE — Patient Instructions (Signed)
Morton Neuralgia  Morton neuralgia is foot pain that affects the ball of the foot and the area near the toes. Morton neuralgia occurs when part of a nerve in the foot (digital nerve) is under too much pressure (compressed). When this happens over a long period of time, the nerve can thicken (neuroma) and cause pain. Pain usually occurs between the third and fourth toes.  Morton neuralgia can come and go but may get worse over time. What are the causes? This condition is caused by doing the same things over and over with your foot, such as: Activities such as running or jumping. Wearing shoes that are too tight. What increases the risk? You may be at higher risk for Morton neuralgia if you: Are male. Wear high heels. Wear shoes that are narrow or tight. Do activities that repeatedly stretch your toes, such as: Running. Ballet. Long-distance walking. What are the signs or symptoms? The first symptom of Morton neuralgia is pain that spreads from the ball of the foot to the toes. It may feel like you are walking on a marble. Pain usually gets worse with walking and goes away at night. Other symptoms may include numbness and cramping of your toes. Both feet are equally affected, but rarelyat the same time. How is this diagnosed? This condition is diagnosed based on your symptoms, your medical history, and a physical exam. Your health care provider may: Squeeze your foot just behind your toe. Ask you to move your toes to check for pain. Ask about your physical activity level. You also may have imaging tests, such as an X-ray, ultrasound, or MRI. How is this treated? Treatment depends on how severe your condition is and what causes it. Treatment may involve: Wearing different shoes that are not too tight, are low-heeled, and provide good support. For some people, this is the only treatment needed. Wearing an over-the-counter or custom supportive pad (orthotic) under the front of your  foot. Getting injections of numbing medicine and anti-inflammatory medicine (steroid) in the nerve. Having surgery to remove part of the thickened nerve. Follow these instructions at home: Managing pain, stiffness, and swelling  Massage your foot as needed. Wear orthotics as told by your health care provider. If directed, put ice on your foot: Put ice in a plastic bag. Place a towel between your skin and the bag. Leave the ice on for 20 minutes, 2-3 times a day. Avoid activities that cause pain or make pain worse. If you play sports, ask your health care provider when it is safe for you to return to sports. Raise (elevate) your foot above the level of your heart while lying down and, when possible, while sitting.  General instructions Take over-the-counter and prescription medicines only as told by your health care provider. Do not drive or use heavy machinery while taking prescription pain medicine. Wear shoes that: Have soft soles. Have a wide toe area. Provide arch support. Do not pinch or squeeze your feet. Have room for your orthotics, if applicable. Keep all follow-up visits as told by your health care provider. This is important. Contact a health care provider if: Your symptoms get worse or do not get better with treatment and home care. Summary Morton neuralgia is foot pain that affects the ball of the foot and the area near the toes. Pain usually occurs between the third and fourth toes, gets worse with walking, and goes away at night. Morton neuralgia occurs when part of a nerve in the foot (digital nerve)   is under too much pressure. When this happens over a long period of time, the nerve can thicken (neuroma) and cause pain. This condition is caused by doing the same things over and over with your foot, such as running or jumping, wearing shoes that are too tight, or wearing high heels. Treatment may involve wearing low-heeled shoes that are not too tight, wearing a  supportive pad (orthotic) under the front of your foot, getting injections in the nerve, or having surgery to remove part of the thickened nerve. This information is not intended to replace advice given to you by your health care provider. Make sure you discuss any questions you have with your healthcare provider. Document Revised: 08/26/2017 Document Reviewed: 08/26/2017 Elsevier Patient Education  2022 [employer] Inc[employer]  

## 2021-03-07 ENCOUNTER — Encounter: Payer: Self-pay | Admitting: Podiatrist

## 2021-03-07 DIAGNOSIS — G5761 Lesion of plantar nerve, right lower limb: Secondary | ICD-10-CM | POA: Diagnosis not present

## 2021-03-07 MED ORDER — TRIAMCINOLONE ACETONIDE 10 MG/ML IJ SUSP
10.0000 mg | Freq: Once | INTRAMUSCULAR | Status: AC
  Administered 2021-03-07: 10 mg

## 2021-03-07 NOTE — Progress Notes (Signed)
Chief Complaint  Patient presents with   Plantar Fasciitis    F/U Rt PF -pt states," it's been great very little pain." -no swelling -still can'ty walk long distances Tx: stretching and IBU    Neuroma    F/U Lt neuroma -pt states," the pad of my foot is still hurting ut not as bad; 3/10." No swelling      HPI: Patient is 50 y.o. male who presents today for follow-up of foot pain bilateral.  He relates the pain from the plantar fasciitis on the right is better since the injections however he still cannot walk walk long distances.  He has been stretching and taking ibuprofen as well.  He also relates that in the area of the neuroma on the left foot it is a little bit better he states that the pad of the foot is hurting but not as bad.  He has pain at the same area of the right foot today.   He also states that he never got the Lamisil medication and would like a written prescription to take to [facility].   No Known Allergies  Review of systems is reviewed and negative.   Physical Exam  Patient is awake, alert, and oriented x 3.  In no acute distress.    Vascular status is intact with palpable pedal pulses DP and PT bilateral and capillary refill time less than 3 seconds bilateral.  No edema or erythema noted.   Neurological exam reveals epicritic and protective sensation grossly intact bilateral.   Dermatological exam reveals skin is supple and dry to bilateral feet.  No open lesions present.  Digital nails are positive for onychomycosis  Musculoskeletal exam: Improved pain in the area of the plantar fasciitis on the right foot is noted with palpation.  Continued pain at the third interspace of the right foot is noted at today's visit consistent with a neuroma.  A palpable click is identified. Pain decreased third interspace left foot.   Assessment: 1. Morton neuroma, right   2. Onychomycosis due to dermatophyte   3. Plantar fascial fibromatosis of right foot      Plan: Treatment  options and alternatives discussed with the patient.  He would like an injection for the pain on the right foot and I agreed I prepped the skin with alcohol infiltrated 10 mg of Kenalog with lidocaine plain into the area of maximal tenderness at the third interspace.  I also recommended some power step inserts for him to try to help resolve the plantar fasciitis. Prescription was written and printed for Lamisil for him to take to his preferred pharmacy and he may start taking this as it is it is filled. He will return for follow-up of the plantar fasciitis and forefoot pain in 3 to 4 weeks and if any concerns or problems arise prior to that visit he is instructed to call.

## 2021-03-21 ENCOUNTER — Ambulatory Visit (INDEPENDENT_AMBULATORY_CARE_PROVIDER_SITE_OTHER): Payer: BC Managed Care – PPO | Admitting: Podiatry

## 2021-03-21 ENCOUNTER — Encounter: Payer: Self-pay | Admitting: Podiatry

## 2021-03-21 ENCOUNTER — Other Ambulatory Visit: Payer: Self-pay

## 2021-03-21 DIAGNOSIS — M7751 Other enthesopathy of right foot: Secondary | ICD-10-CM | POA: Diagnosis not present

## 2021-03-21 DIAGNOSIS — M216X1 Other acquired deformities of right foot: Secondary | ICD-10-CM | POA: Diagnosis not present
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
## 2021-05-02 ENCOUNTER — Other Ambulatory Visit: Payer: Self-pay

## 2021-05-02 ENCOUNTER — Ambulatory Visit (INDEPENDENT_AMBULATORY_CARE_PROVIDER_SITE_OTHER): Payer: BC Managed Care – PPO | Admitting: Podiatry

## 2021-05-02 DIAGNOSIS — Q6672 Congenital pes cavus, left foot: Secondary | ICD-10-CM | POA: Diagnosis not present

## 2021-05-02 DIAGNOSIS — M7751 Other enthesopathy of right foot: Secondary | ICD-10-CM

## 2021-05-02 DIAGNOSIS — Q667 Congenital pes cavus, unspecified foot: Secondary | ICD-10-CM | POA: Diagnosis not present

## 2021-05-04 ENCOUNTER — Encounter: Payer: Self-pay | Admitting: Podiatry
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
## 2021-06-11 ENCOUNTER — Telehealth: Payer: Self-pay | Admitting: Endocrinology

## 2021-06-11 DIAGNOSIS — E291 Testicular hypofunction: Secondary | ICD-10-CM

## 2021-06-11 NOTE — Telephone Encounter (Signed)
Patient called and requested that we print a paper RX for the Axiron.  He needs it for a 90 day supply. He is going to be taking it to a pharmacy where for a 90 day supply he can get significant cost savings to him.    Please call patient with questions and to advise when ready for pick up [phone]

## 2021-06-12 MED ORDER — TESTOSTERONE 30 MG/ACT TD SOLN: TRANSDERMAL | 3 refills | Status: DC

## 2021-06-13 ENCOUNTER — Encounter: Payer: Self-pay | Admitting: Podiatry

## 2021-06-13 ENCOUNTER — Other Ambulatory Visit: Payer: Self-pay

## 2021-06-13 ENCOUNTER — Ambulatory Visit (INDEPENDENT_AMBULATORY_CARE_PROVIDER_SITE_OTHER): Payer: BC Managed Care – PPO | Admitting: Podiatry

## 2021-06-13 DIAGNOSIS — M7751 Other enthesopathy of right foot: Secondary | ICD-10-CM | POA: Diagnosis not present

## 2021-06-13 DIAGNOSIS — Q667 Congenital pes cavus, unspecified foot: Secondary | ICD-10-CM | POA: Diagnosis not present
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
## 2021-06-14 NOTE — Telephone Encounter (Signed)
Rx is signed and I tried calling patient to let him know it is ready for pickup. No answer. Left vm

## 2021-06-18 ENCOUNTER — Other Ambulatory Visit: Payer: Self-pay

## 2021-06-18 ENCOUNTER — Other Ambulatory Visit (INDEPENDENT_AMBULATORY_CARE_PROVIDER_SITE_OTHER): Payer: BC Managed Care – PPO

## 2021-06-18 DIAGNOSIS — E291 Testicular hypofunction: Secondary | ICD-10-CM

## 2021-06-18 LAB — CBC
HCT: 52.3 % — ABNORMAL HIGH (ref 39.0–52.0)
Hemoglobin: 17.6 g/dL — ABNORMAL HIGH (ref 13.0–17.0)
MCHC: 33.7 g/dL (ref 30.0–36.0)
MCV: 85.9 fl (ref 78.0–100.0)
Platelets: 207 10*3/uL (ref 150.0–400.0)
RBC: 6.09 Mil/uL — ABNORMAL HIGH (ref 4.22–5.81)
RDW: 14.7 % (ref 11.5–15.5)
WBC: 5.8 10*3/uL (ref 4.0–10.5)

## 2021-06-18 LAB — TESTOSTERONE: Testosterone: 232.18 ng/dL — ABNORMAL LOW (ref 300.00–890.00)
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
## 2021-06-20 ENCOUNTER — Ambulatory Visit: Payer: BC Managed Care – PPO | Admitting: Endocrinology

## 2021-06-22 ENCOUNTER — Ambulatory Visit (INDEPENDENT_AMBULATORY_CARE_PROVIDER_SITE_OTHER): Payer: BC Managed Care – PPO | Admitting: Endocrinology

## 2021-06-22 ENCOUNTER — Other Ambulatory Visit: Payer: Self-pay

## 2021-06-22 VITALS — BP 122/82 | HR 74 | Ht 74.0 in | Wt 275.6 lb

## 2021-06-22 DIAGNOSIS — D751 Secondary polycythemia: Secondary | ICD-10-CM

## 2021-06-22 DIAGNOSIS — E291 Testicular hypofunction: Secondary | ICD-10-CM

## 2021-06-22 MED ORDER — NATESTO 5.5 MG/ACT NA GEL: NASAL | 2 refills | Status: DC

## 2021-06-22 NOTE — Patient Instructions (Signed)
Check Natesto  Axiron 4 pumps daily
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
## 2021-06-26 ENCOUNTER — Other Ambulatory Visit: Payer: Self-pay

## 2021-06-26 DIAGNOSIS — E291 Testicular hypofunction: Secondary | ICD-10-CM

## 2021-06-26 MED ORDER — SILDENAFIL CITRATE 100 MG PO TABS: 100.0000 mg | ORAL_TABLET | Freq: Every day | ORAL | 2 refills | Status: DC | PRN

## 2021-06-29 ENCOUNTER — Telehealth: Payer: Self-pay | Admitting: Endocrinology

## 2021-06-29 ENCOUNTER — Other Ambulatory Visit: Payer: Self-pay

## 2021-06-29 DIAGNOSIS — E291 Testicular hypofunction: Secondary | ICD-10-CM

## 2021-06-29 MED ORDER — SILDENAFIL CITRATE 100 MG PO TABS: 100.0000 mg | ORAL_TABLET | Freq: Every day | ORAL | 2 refills | Status: DC | PRN

## 2021-06-29 NOTE — Telephone Encounter (Signed)
  [id number], [place] - [address] AT [facility] [address] calling requesting   sildenafil (VIAGRA) 100 MG tablet be sent to them so they can fill it.

## 2021-06-29 NOTE — Telephone Encounter (Signed)
Rx sent 

## 2021-09-20 ENCOUNTER — Telehealth: Payer: Self-pay

## 2021-09-20 NOTE — Telephone Encounter (Signed)
Pharmacy requesting refill on testosterone medication. Please send

## 2021-09-21 ENCOUNTER — Other Ambulatory Visit: Payer: Self-pay | Admitting: Endocrinology

## 2021-09-21 MED ORDER — NATESTO 5.5 MG/ACT NA GEL: NASAL | 0 refills | Status: DC

## 2021-09-26 ENCOUNTER — Other Ambulatory Visit: Payer: Self-pay

## 2021-09-26 ENCOUNTER — Other Ambulatory Visit (INDEPENDENT_AMBULATORY_CARE_PROVIDER_SITE_OTHER): Payer: BC Managed Care – PPO

## 2021-09-26 ENCOUNTER — Telehealth: Payer: Self-pay

## 2021-09-26 DIAGNOSIS — D751 Secondary polycythemia: Secondary | ICD-10-CM

## 2021-09-26 DIAGNOSIS — E291 Testicular hypofunction: Secondary | ICD-10-CM | POA: Diagnosis not present

## 2021-09-26 LAB — CBC
HCT: 46.3 % (ref 39.0–52.0)
Hemoglobin: 15.3 g/dL (ref 13.0–17.0)
MCHC: 32.9 g/dL (ref 30.0–36.0)
MCV: 87.9 fl (ref 78.0–100.0)
Platelets: 212 10*3/uL (ref 150.0–400.0)
RBC: 5.27 Mil/uL (ref 4.22–5.81)
RDW: 14.9 % (ref 11.5–15.5)
WBC: 6 10*3/uL (ref 4.0–10.5)

## 2021-09-26 LAB — TESTOSTERONE: Testosterone: 307.28 ng/dL (ref 300.00–890.00)

## 2021-09-26 NOTE — Telephone Encounter (Signed)
[facility] pharmacy called in requesting sildenafil and testosterone 30mg /1.30mL pump be sent. I called patient because I seen Natesto was sent last week. He states Natesto doesn't work and causes him to have headaches so he has been using the old testosterone medication. Please send Rx in.

## 2021-09-27 ENCOUNTER — Other Ambulatory Visit: Payer: Self-pay

## 2021-09-27 ENCOUNTER — Other Ambulatory Visit: Payer: Self-pay | Admitting: Endocrinology

## 2021-09-27 DIAGNOSIS — E291 Testicular hypofunction: Secondary | ICD-10-CM

## 2021-09-27 MED ORDER — SILDENAFIL CITRATE 100 MG PO TABS: 100.0000 mg | ORAL_TABLET | Freq: Every day | ORAL | 2 refills | Status: DC | PRN

## 2021-09-27 MED ORDER — TESTOSTERONE 30 MG/ACT TD SOLN: TRANSDERMAL | 3 refills | Status: DC

## 2021-10-01 ENCOUNTER — Ambulatory Visit (INDEPENDENT_AMBULATORY_CARE_PROVIDER_SITE_OTHER): Payer: BC Managed Care – PPO | Admitting: Endocrinology

## 2021-10-01 ENCOUNTER — Encounter: Payer: Self-pay | Admitting: Endocrinology

## 2021-10-01 ENCOUNTER — Other Ambulatory Visit: Payer: Self-pay

## 2021-10-01 VITALS — BP 148/88 | HR 94 | Ht 74.0 in | Wt 286.4 lb

## 2021-10-01 DIAGNOSIS — D751 Secondary polycythemia: Secondary | ICD-10-CM | POA: Diagnosis not present

## 2021-10-01 DIAGNOSIS — E291 Testicular hypofunction: Secondary | ICD-10-CM
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
## 2021-10-01 NOTE — Patient Instructions (Signed)
Stay on 5 pumps daily

## 2021-12-10 ENCOUNTER — Other Ambulatory Visit: Payer: Self-pay

## 2021-12-10 DIAGNOSIS — E291 Testicular hypofunction: Secondary | ICD-10-CM

## 2021-12-10 MED ORDER — SILDENAFIL CITRATE 100 MG PO TABS: 100.0000 mg | ORAL_TABLET | Freq: Every day | ORAL | 2 refills | Status: DC | PRN

## 2022-01-28 ENCOUNTER — Other Ambulatory Visit (INDEPENDENT_AMBULATORY_CARE_PROVIDER_SITE_OTHER): Payer: BC Managed Care – PPO

## 2022-01-28 DIAGNOSIS — E291 Testicular hypofunction: Secondary | ICD-10-CM

## 2022-01-28 DIAGNOSIS — D751 Secondary polycythemia: Secondary | ICD-10-CM

## 2022-01-28 LAB — CBC
HCT: 45 % (ref 39.0–52.0)
Hemoglobin: 15 g/dL (ref 13.0–17.0)
MCHC: 33.3 g/dL (ref 30.0–36.0)
MCV: 84.6 fl (ref 78.0–100.0)
Platelets: 212 10*3/uL (ref 150.0–400.0)
RBC: 5.32 Mil/uL (ref 4.22–5.81)
RDW: 15.1 % (ref 11.5–15.5)
WBC: 5.5 10*3/uL (ref 4.0–10.5)

## 2022-01-28 LAB — TESTOSTERONE: Testosterone: 174.21 ng/dL — ABNORMAL LOW (ref 300.00–890.00)

## 2022-01-31 ENCOUNTER — Encounter: Payer: Self-pay | Admitting: Endocrinology

## 2022-01-31 ENCOUNTER — Ambulatory Visit (INDEPENDENT_AMBULATORY_CARE_PROVIDER_SITE_OTHER): Payer: BC Managed Care – PPO | Admitting: Endocrinology

## 2022-01-31 VITALS — BP 122/78 | HR 64 | Ht 74.0 in | Wt 286.0 lb

## 2022-01-31 DIAGNOSIS — E291 Testicular hypofunction: Secondary | ICD-10-CM | POA: Diagnosis not present

## 2022-01-31 LAB — TESTOSTERONE: Testosterone: 338.94 ng/dL (ref 300.00–890.00)
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
## 2022-04-12 ENCOUNTER — Other Ambulatory Visit: Payer: Self-pay

## 2022-04-12 DIAGNOSIS — E291 Testicular hypofunction: Secondary | ICD-10-CM

## 2022-04-12 MED ORDER — SILDENAFIL CITRATE 100 MG PO TABS: 100.0000 mg | ORAL_TABLET | Freq: Every day | ORAL | 2 refills | Status: DC | PRN

## 2022-04-18 ENCOUNTER — Other Ambulatory Visit: Payer: Self-pay | Admitting: Endocrinology

## 2022-04-18 DIAGNOSIS — E291 Testicular hypofunction: Secondary | ICD-10-CM

## 2022-04-18 MED ORDER — TESTOSTERONE 30 MG/ACT TD SOLN: TRANSDERMAL | 3 refills | Status: DC

## 2022-07-03 ENCOUNTER — Other Ambulatory Visit: Payer: Self-pay

## 2022-07-03 DIAGNOSIS — E291 Testicular hypofunction: Secondary | ICD-10-CM

## 2022-07-03 MED ORDER — SILDENAFIL CITRATE 100 MG PO TABS: 100.0000 mg | ORAL_TABLET | Freq: Every day | ORAL | 2 refills | Status: DC | PRN

## 2022-09-25 ENCOUNTER — Other Ambulatory Visit: Payer: Self-pay | Admitting: Internal Medicine

## 2022-09-25 DIAGNOSIS — E291 Testicular hypofunction: Secondary | ICD-10-CM

## 2022-10-13 ENCOUNTER — Telehealth: Payer: Self-pay

## 2022-10-13 NOTE — Telephone Encounter (Signed)
Refill request on Testosterone (AXIRON) 30 MG/ACT SOLN from [facility].

## 2022-10-14 NOTE — Telephone Encounter (Signed)
Message left on cell phone voice mail for patient to call office to schedule office visit and labs per provider request.

## 2022-10-18 NOTE — Telephone Encounter (Signed)
Patient scheduled for next available on March the 20th with labs a few days prior

## 2022-10-19 ENCOUNTER — Other Ambulatory Visit: Payer: Self-pay | Admitting: Endocrinology

## 2022-10-19 DIAGNOSIS — E291 Testicular hypofunction: Secondary | ICD-10-CM

## 2022-10-19 MED ORDER — TESTOSTERONE 30 MG/ACT TD SOLN: TRANSDERMAL | 3 refills | Status: DC

## 2022-11-11 ENCOUNTER — Other Ambulatory Visit (INDEPENDENT_AMBULATORY_CARE_PROVIDER_SITE_OTHER): Payer: BC Managed Care – PPO

## 2022-11-11 DIAGNOSIS — E291 Testicular hypofunction: Secondary | ICD-10-CM | POA: Diagnosis not present

## 2022-11-11 LAB — CBC
HCT: 43.4 % (ref 39.0–52.0)
Hemoglobin: 14.9 g/dL (ref 13.0–17.0)
MCHC: 34.3 g/dL (ref 30.0–36.0)
MCV: 86.1 fl (ref 78.0–100.0)
Platelets: 226 10*3/uL (ref 150.0–400.0)
RBC: 5.04 Mil/uL (ref 4.22–5.81)
RDW: 14.5 % (ref 11.5–15.5)
WBC: 6.5 10*3/uL (ref 4.0–10.5)

## 2022-11-11 LAB — TESTOSTERONE: Testosterone: 449.93 ng/dL (ref 300.00–890.00)

## 2022-11-13 ENCOUNTER — Encounter: Payer: Self-pay | Admitting: Endocrinology

## 2022-11-13 ENCOUNTER — Ambulatory Visit (INDEPENDENT_AMBULATORY_CARE_PROVIDER_SITE_OTHER): Payer: BC Managed Care – PPO | Admitting: Endocrinology

## 2022-11-13 VITALS — BP 136/82 | HR 79 | Ht 74.0 in | Wt 290.2 lb

## 2022-11-13 DIAGNOSIS — E291 Testicular hypofunction: Secondary | ICD-10-CM

## 2022-11-13 MED ORDER — TESTOSTERONE CYPIONATE 100 MG/ML IM SOLN: INTRAMUSCULAR | 3 refills | Status: DC
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
## 2022-11-20 ENCOUNTER — Other Ambulatory Visit: Payer: Self-pay | Admitting: Endocrinology

## 2022-11-20 ENCOUNTER — Telehealth: Payer: Self-pay | Admitting: *Deleted

## 2022-11-20 MED ORDER — TESTOSTERONE CYPIONATE 200 MG/ML IM SOLN: 50.0000 mg | INTRAMUSCULAR | 2 refills | Status: DC

## 2022-11-20 NOTE — Telephone Encounter (Signed)
Pharmacy stated need new Rx. Please send testosterone 200 mg/ml to [facility]

## 2022-11-20 NOTE — Telephone Encounter (Signed)
Pt called--stated [facility] notified pt  testosterone 100 mg/ml is in back order, but have available for the 200 mg/ml. Pt questioning if ok for the 200mg /ml. Please advise

## 2023-01-01 ENCOUNTER — Other Ambulatory Visit: Payer: Self-pay | Admitting: Internal Medicine

## 2023-01-01 DIAGNOSIS — E291 Testicular hypofunction: Secondary | ICD-10-CM

## 2023-01-29 ENCOUNTER — Other Ambulatory Visit (INDEPENDENT_AMBULATORY_CARE_PROVIDER_SITE_OTHER): Payer: BC Managed Care – PPO

## 2023-01-29 DIAGNOSIS — E291 Testicular hypofunction: Secondary | ICD-10-CM

## 2023-01-29 LAB — CBC
HCT: 49.3 % (ref 39.0–52.0)
Hemoglobin: 15.8 g/dL (ref 13.0–17.0)
MCHC: 32 g/dL (ref 30.0–36.0)
MCV: 84.9 fl (ref 78.0–100.0)
Platelets: 248 10*3/uL (ref 150.0–400.0)
RBC: 5.8 Mil/uL (ref 4.22–5.81)
RDW: 15.5 % (ref 11.5–15.5)
WBC: 6 10*3/uL (ref 4.0–10.5)

## 2023-01-29 LAB — TESTOSTERONE: Testosterone: 338.33 ng/dL (ref 300.00–890.00)

## 2023-04-09 ENCOUNTER — Other Ambulatory Visit: Payer: Self-pay

## 2023-04-09 ENCOUNTER — Other Ambulatory Visit (INDEPENDENT_AMBULATORY_CARE_PROVIDER_SITE_OTHER): Payer: BC Managed Care – PPO

## 2023-04-09 DIAGNOSIS — E291 Testicular hypofunction: Secondary | ICD-10-CM

## 2023-04-09 LAB — TESTOSTERONE: Testosterone: 491.63 ng/dL (ref 300.00–890.00)
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
## 2023-04-15 ENCOUNTER — Encounter: Payer: Self-pay | Admitting: Endocrinology

## 2023-04-15 ENCOUNTER — Ambulatory Visit (INDEPENDENT_AMBULATORY_CARE_PROVIDER_SITE_OTHER): Payer: BC Managed Care – PPO | Admitting: Endocrinology

## 2023-04-15 VITALS — BP 130/80 | HR 86 | Ht 74.0 in | Wt 288.0 lb

## 2023-04-15 DIAGNOSIS — E291 Testicular hypofunction: Secondary | ICD-10-CM | POA: Diagnosis not present

## 2023-04-15 MED ORDER — TESTOSTERONE CYPIONATE 200 MG/ML IM SOLN: 50.0000 mg | INTRAMUSCULAR | 2 refills | Status: DC

## 2023-04-15 MED ORDER — SILDENAFIL CITRATE 100 MG PO TABS
100.0000 mg | ORAL_TABLET | ORAL | 2 refills | Status: DC | PRN
Start: 2023-04-15 — End: 2023-06-25

## 2023-04-30 ENCOUNTER — Telehealth: Payer: Self-pay

## 2023-04-30 NOTE — Telephone Encounter (Signed)
Can you resend the Testosterone prescription. It looks like it was on print instead of normal and didn't electronically send for Dr. Lucianne Muss.

## 2023-05-01 ENCOUNTER — Other Ambulatory Visit: Payer: Self-pay | Admitting: Endocrinology

## 2023-05-01 MED ORDER — TESTOSTERONE CYPIONATE 200 MG/ML IM SOLN: 50.0000 mg | INTRAMUSCULAR | 2 refills | Status: DC

## 2023-06-24 ENCOUNTER — Telehealth: Payer: Self-pay | Admitting: Endocrinology

## 2023-06-24 NOTE — Telephone Encounter (Signed)
MEDICATION:  1) testosterone cypionate testosterone cypionate (DEPO-TESTOSTERONE) 200 MG/ML injection  2)sildenafil sildenafil (VIAGRA) 100 MG tablet  PHARMACY: [facility] Address: [address] [place], [place], [place] [address] Phone: [phone]  HAS THE PATIENT CONTACTED THEIR PHARMACY?  No  IS THIS A 90 DAY SUPPLY : Yes  IS PATIENT OUT OF MEDICATION:   IF NOT; HOW MUCH IS LEFT:   LAST APPOINTMENT DATE: @ 8/202024  NEXT APPOINTMENT DATE:@2 /28/2025  DO WE HAVE YOUR PERMISSION TO LEAVE A DETAILED MESSAGE?:Yes  OTHER COMMENTS:    **Let patient know to contact pharmacy at the end of the day to make sure medication is ready. **  ** Please notify patient to allow 48-72 hours to process**  **Encourage patient to contact the pharmacy for refills or they can request refills through [facility]**

## 2023-06-25 ENCOUNTER — Other Ambulatory Visit: Payer: Self-pay | Admitting: Endocrinology

## 2023-06-25 DIAGNOSIS — E291 Testicular hypofunction: Secondary | ICD-10-CM

## 2023-06-25 MED ORDER — SILDENAFIL CITRATE 100 MG PO TABS: 100.0000 mg | ORAL_TABLET | ORAL | 2 refills | Status: DC | PRN

## 2023-06-25 MED ORDER — TESTOSTERONE CYPIONATE 200 MG/ML IM SOLN: 50.0000 mg | INTRAMUSCULAR | 2 refills | Status: DC

## 2023-07-22 ENCOUNTER — Telehealth: Payer: Self-pay

## 2023-07-22 MED ORDER — TESTOSTERONE CYPIONATE 200 MG/ML IM SOLN: 50.0000 mg | INTRAMUSCULAR | 2 refills | Status: DC

## 2023-07-22 NOTE — Telephone Encounter (Signed)
Pharmacy requesting refill for testosterone

## 2023-07-22 NOTE — Telephone Encounter (Signed)
Done

## 2023-09-03 ENCOUNTER — Telehealth: Payer: Self-pay | Admitting: Family Medicine

## 2023-09-03 NOTE — Telephone Encounter (Signed)
 MD has not seen Pt since April 2022. Called Pt to schedule a CPE.  LVM for Pt to call us back.

## 2023-09-19 ENCOUNTER — Ambulatory Visit (INDEPENDENT_AMBULATORY_CARE_PROVIDER_SITE_OTHER): Payer: BC Managed Care – PPO | Admitting: Family Medicine

## 2023-09-19 ENCOUNTER — Ambulatory Visit (INDEPENDENT_AMBULATORY_CARE_PROVIDER_SITE_OTHER): Payer: BC Managed Care – PPO

## 2023-09-19 VITALS — BP 120/80 | HR 76 | Temp 98.4°F | Ht 74.0 in | Wt 288.4 lb

## 2023-09-19 DIAGNOSIS — Z0001 Encounter for general adult medical examination with abnormal findings: Secondary | ICD-10-CM | POA: Diagnosis not present

## 2023-09-19 DIAGNOSIS — M4807 Spinal stenosis, lumbosacral region: Secondary | ICD-10-CM | POA: Diagnosis not present

## 2023-09-19 DIAGNOSIS — R600 Localized edema: Secondary | ICD-10-CM

## 2023-09-19 DIAGNOSIS — M4317 Spondylolisthesis, lumbosacral region: Secondary | ICD-10-CM | POA: Diagnosis not present

## 2023-09-19 DIAGNOSIS — E291 Testicular hypofunction: Secondary | ICD-10-CM | POA: Diagnosis not present

## 2023-09-19 DIAGNOSIS — M5417 Radiculopathy, lumbosacral region: Secondary | ICD-10-CM

## 2023-09-19 DIAGNOSIS — M5412 Radiculopathy, cervical region: Secondary | ICD-10-CM

## 2023-09-19 DIAGNOSIS — Z125 Encounter for screening for malignant neoplasm of prostate: Secondary | ICD-10-CM

## 2023-09-19 DIAGNOSIS — R5383 Other fatigue: Secondary | ICD-10-CM

## 2023-09-19 DIAGNOSIS — Z1211 Encounter for screening for malignant neoplasm of colon: Secondary | ICD-10-CM

## 2023-09-19 DIAGNOSIS — M4722 Other spondylosis with radiculopathy, cervical region: Secondary | ICD-10-CM | POA: Diagnosis not present

## 2023-09-19 DIAGNOSIS — M50121 Cervical disc disorder at C4-C5 level with radiculopathy: Secondary | ICD-10-CM | POA: Diagnosis not present

## 2023-09-19 DIAGNOSIS — M4726 Other spondylosis with radiculopathy, lumbar region: Secondary | ICD-10-CM | POA: Diagnosis not present

## 2023-09-19 LAB — LIPID PANEL
Cholesterol: 218 mg/dL — ABNORMAL HIGH (ref 0–200)
HDL: 39.5 mg/dL (ref >39.00)
LDL Cholesterol: 144 mg/dL — ABNORMAL HIGH (ref 0–99)
NonHDL: 178.34
Total CHOL/HDL Ratio: 6
Triglycerides: 173 mg/dL — ABNORMAL HIGH (ref 0.0–149.0)
VLDL: 34.6 mg/dL (ref 0.0–40.0)

## 2023-09-19 LAB — CBC WITH DIFFERENTIAL/PLATELET
Basophils Absolute: 0 10*3/uL (ref 0.0–0.1)
Basophils Relative: 0.5 % (ref 0.0–3.0)
Eosinophils Absolute: 0.1 10*3/uL (ref 0.0–0.7)
Eosinophils Relative: 1.8 % (ref 0.0–5.0)
HCT: 49.5 % (ref 39.0–52.0)
Hemoglobin: 16.3 g/dL (ref 13.0–17.0)
Lymphocytes Relative: 22.5 % (ref 12.0–46.0)
Lymphs Abs: 1.5 10*3/uL (ref 0.7–4.0)
MCHC: 32.9 g/dL (ref 30.0–36.0)
MCV: 83.1 fL (ref 78.0–100.0)
Monocytes Absolute: 0.5 10*3/uL (ref 0.1–1.0)
Monocytes Relative: 7.7 % (ref 3.0–12.0)
Neutro Abs: 4.4 10*3/uL (ref 1.4–7.7)
Neutrophils Relative %: 67.5 % (ref 43.0–77.0)
Platelets: 203 10*3/uL (ref 150.0–400.0)
RBC: 5.95 Mil/uL — ABNORMAL HIGH (ref 4.22–5.81)
RDW: 17.4 % — ABNORMAL HIGH (ref 11.5–15.5)
WBC: 6.4 10*3/uL (ref 4.0–10.5)

## 2023-09-19 LAB — T4, FREE: Free T4: 0.85 ng/dL (ref 0.60–1.60)

## 2023-09-19 LAB — PSA: PSA: 0.7 ng/mL (ref 0.10–4.00)

## 2023-09-19 LAB — COMPREHENSIVE METABOLIC PANEL
ALT: 39 U/L (ref 0–53)
AST: 36 U/L (ref 0–37)
Albumin: 4.6 g/dL (ref 3.5–5.2)
Alkaline Phosphatase: 73 U/L (ref 39–117)
BUN: 14 mg/dL (ref 6–23)
CO2: 28 meq/L (ref 19–32)
Calcium: 9 mg/dL (ref 8.4–10.5)
Chloride: 102 meq/L (ref 96–112)
Creatinine, Ser: 1.17 mg/dL (ref 0.40–1.50)
GFR: 70.68 mL/min (ref >60.00)
Glucose, Bld: 83 mg/dL (ref 70–99)
Potassium: 4.1 meq/L (ref 3.5–5.1)
Sodium: 138 meq/L (ref 135–145)
Total Bilirubin: 0.8 mg/dL (ref 0.2–1.2)
Total Protein: 7.1 g/dL (ref 6.0–8.3)

## 2023-09-19 LAB — TESTOSTERONE: Testosterone: 278.09 ng/dL — ABNORMAL LOW (ref 300.00–890.00)

## 2023-09-19 LAB — TSH: TSH: 1.7 u[IU]/mL (ref 0.35–5.50)

## 2023-09-19 LAB — VITAMIN B12: Vitamin B-12: 246 pg/mL (ref 211–911)

## 2023-09-19 LAB — HEMOGLOBIN A1C: Hgb A1c MFr Bld: 6.3 % (ref 4.6–6.5)

## 2023-09-19 LAB — VITAMIN D 25 HYDROXY (VIT D DEFICIENCY, FRACTURES): VITD: 11.23 ng/mL — ABNORMAL LOW (ref 30.00–100.00)
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
## 2023-09-20 LAB — D-DIMER, QUANTITATIVE: D-Dimer, Quant: 0.45 ug{FEU}/mL (ref <0.50)

## 2023-09-22 ENCOUNTER — Other Ambulatory Visit: Payer: Self-pay | Admitting: Endocrinology

## 2023-09-22 ENCOUNTER — Telehealth: Payer: Self-pay

## 2023-09-22 DIAGNOSIS — E291 Testicular hypofunction: Secondary | ICD-10-CM

## 2023-09-22 DIAGNOSIS — J0100 Acute maxillary sinusitis, unspecified: Secondary | ICD-10-CM

## 2023-09-22 MED ORDER — TESTOSTERONE CYPIONATE 200 MG/ML IM SOLN: 50.0000 mg | INTRAMUSCULAR | 2 refills | Status: DC

## 2023-09-22 MED ORDER — SILDENAFIL CITRATE 100 MG PO TABS: 100.0000 mg | ORAL_TABLET | ORAL | 2 refills | Status: DC | PRN

## 2023-09-22 NOTE — Telephone Encounter (Signed)
Sent prescriptions

## 2023-09-22 NOTE — Telephone Encounter (Signed)
Refills needed for Testerone and Sildenafil

## 2023-09-22 NOTE — Telephone Encounter (Signed)
complete

## 2023-09-25 ENCOUNTER — Telehealth: Payer: BC Managed Care – PPO | Admitting: Physician Assistant

## 2023-09-25 DIAGNOSIS — J069 Acute upper respiratory infection, unspecified: Secondary | ICD-10-CM | POA: Diagnosis not present

## 2023-09-25 MED ORDER — BENZONATATE 100 MG PO CAPS: 100.0000 mg | ORAL_CAPSULE | Freq: Three times a day (TID) | ORAL | 0 refills | Status: DC | PRN

## 2023-09-25 NOTE — Progress Notes (Signed)
I have spent 5 minutes in review of e-visit questionnaire, review and updating patient chart, medical decision making and response to patient.   Piedad Climes, PA-C

## 2023-09-25 NOTE — Progress Notes (Signed)

## 2023-09-28 ENCOUNTER — Encounter: Payer: Self-pay | Admitting: Family Medicine

## 2023-09-28 ENCOUNTER — Other Ambulatory Visit: Payer: Self-pay | Admitting: Family Medicine

## 2023-09-28 DIAGNOSIS — E559 Vitamin D deficiency, unspecified: Secondary | ICD-10-CM

## 2023-09-28 MED ORDER — VITAMIN D (ERGOCALCIFEROL) 1.25 MG (50000 UNIT) PO CAPS: 50000.0000 [IU] | ORAL_CAPSULE | ORAL | 0 refills | Status: DC

## 2023-09-30 ENCOUNTER — Other Ambulatory Visit: Payer: Self-pay

## 2023-09-30 DIAGNOSIS — M542 Cervicalgia: Secondary | ICD-10-CM

## 2023-10-17 ENCOUNTER — Ambulatory Visit: Payer: BC Managed Care – PPO | Admitting: Family Medicine

## 2023-10-17 ENCOUNTER — Encounter: Payer: Self-pay | Admitting: Family Medicine

## 2023-10-17 ENCOUNTER — Other Ambulatory Visit: Payer: Self-pay

## 2023-10-17 VITALS — BP 136/80 | HR 75 | Temp 98.2°F | Ht 74.0 in | Wt 287.4 lb

## 2023-10-17 DIAGNOSIS — E66812 Obesity, class 2: Secondary | ICD-10-CM

## 2023-10-17 DIAGNOSIS — E782 Mixed hyperlipidemia: Secondary | ICD-10-CM

## 2023-10-17 DIAGNOSIS — M542 Cervicalgia: Secondary | ICD-10-CM

## 2023-10-17 DIAGNOSIS — E291 Testicular hypofunction: Secondary | ICD-10-CM

## 2023-10-17 DIAGNOSIS — R7303 Prediabetes: Secondary | ICD-10-CM | POA: Diagnosis not present

## 2023-10-17 DIAGNOSIS — E559 Vitamin D deficiency, unspecified: Secondary | ICD-10-CM

## 2023-10-17 DIAGNOSIS — Z6836 Body mass index (BMI) 36.0-36.9, adult: Secondary | ICD-10-CM

## 2023-10-17 DIAGNOSIS — M4317 Spondylolisthesis, lumbosacral region: Secondary | ICD-10-CM
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
## 2023-10-21 ENCOUNTER — Encounter: Payer: Self-pay | Admitting: Endocrinology

## 2023-10-21 ENCOUNTER — Other Ambulatory Visit: Payer: BC Managed Care – PPO

## 2023-10-21 DIAGNOSIS — E291 Testicular hypofunction: Secondary | ICD-10-CM | POA: Diagnosis not present

## 2023-10-21 LAB — CBC
HCT: 43.7 % (ref 38.5–50.0)
Hemoglobin: 14.6 g/dL (ref 13.2–17.1)
MCH: 28.7 pg (ref 27.0–33.0)
MCHC: 33.4 g/dL (ref 32.0–36.0)
MCV: 85.9 fL (ref 80.0–100.0)
MPV: 9.7 fL (ref 7.5–12.5)
Platelets: 201 10*3/uL (ref 140–400)
RBC: 5.09 10*6/uL (ref 4.20–5.80)
RDW: 16.2 % — ABNORMAL HIGH (ref 11.0–15.0)
WBC: 5.6 10*3/uL (ref 3.8–10.8)

## 2023-10-24 ENCOUNTER — Other Ambulatory Visit: Payer: Self-pay

## 2023-10-24 ENCOUNTER — Encounter: Payer: Self-pay | Admitting: Endocrinology

## 2023-10-24 ENCOUNTER — Ambulatory Visit (INDEPENDENT_AMBULATORY_CARE_PROVIDER_SITE_OTHER): Payer: BC Managed Care – PPO | Admitting: Endocrinology

## 2023-10-24 VITALS — BP 136/82 | HR 68 | Ht 74.0 in | Wt 288.0 lb

## 2023-10-24 DIAGNOSIS — E230 Hypopituitarism: Secondary | ICD-10-CM

## 2023-10-24 DIAGNOSIS — N529 Male erectile dysfunction, unspecified: Secondary | ICD-10-CM

## 2023-10-24 DIAGNOSIS — E291 Testicular hypofunction: Secondary | ICD-10-CM

## 2023-10-24 MED ORDER — SILDENAFIL CITRATE 100 MG PO TABS: 100.0000 mg | ORAL_TABLET | ORAL | 2 refills | Status: DC | PRN

## 2023-10-24 MED ORDER — SILDENAFIL CITRATE 100 MG PO TABS
100.0000 mg | ORAL_TABLET | ORAL | 2 refills | Status: DC | PRN
Start: 2023-10-24 — End: 2023-10-24
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
## 2023-10-28 ENCOUNTER — Other Ambulatory Visit: Payer: BC Managed Care – PPO

## 2023-10-28 ENCOUNTER — Other Ambulatory Visit: Payer: Self-pay

## 2023-10-28 DIAGNOSIS — E291 Testicular hypofunction: Secondary | ICD-10-CM | POA: Diagnosis not present

## 2023-11-01 LAB — TESTOSTERONE, TOTAL, LC/MS/MS: Testosterone, Total, LC-MS-MS: 673 ng/dL (ref 250–1100)

## 2023-11-03 ENCOUNTER — Encounter: Payer: Self-pay | Admitting: Endocrinology

## 2023-11-23 ENCOUNTER — Other Ambulatory Visit: Payer: Self-pay | Admitting: Endocrinology

## 2023-12-19 ENCOUNTER — Ambulatory Visit (INDEPENDENT_AMBULATORY_CARE_PROVIDER_SITE_OTHER): Payer: BC Managed Care – PPO | Admitting: Family Medicine

## 2023-12-19 ENCOUNTER — Encounter: Payer: Self-pay | Admitting: Family Medicine

## 2023-12-19 VITALS — BP 130/82 | HR 86 | Temp 98.4°F | Ht 74.0 in | Wt 299.8 lb

## 2023-12-19 DIAGNOSIS — E559 Vitamin D deficiency, unspecified: Secondary | ICD-10-CM

## 2023-12-19 DIAGNOSIS — Z6838 Body mass index (BMI) 38.0-38.9, adult: Secondary | ICD-10-CM

## 2023-12-19 DIAGNOSIS — Z23 Encounter for immunization: Secondary | ICD-10-CM

## 2023-12-19 DIAGNOSIS — G4700 Insomnia, unspecified: Secondary | ICD-10-CM

## 2023-12-19 DIAGNOSIS — E782 Mixed hyperlipidemia: Secondary | ICD-10-CM | POA: Diagnosis not present

## 2023-12-19 DIAGNOSIS — R7303 Prediabetes: Secondary | ICD-10-CM

## 2023-12-19 DIAGNOSIS — E66812 Obesity, class 2: Secondary | ICD-10-CM | POA: Diagnosis not present

## 2023-12-19 LAB — LIPID PANEL
Cholesterol: 197 mg/dL (ref 0–200)
HDL: 34.3 mg/dL — ABNORMAL LOW (ref >39.00)
LDL Cholesterol: 131 mg/dL — ABNORMAL HIGH (ref 0–99)
NonHDL: 162.76
Total CHOL/HDL Ratio: 6
Triglycerides: 157 mg/dL — ABNORMAL HIGH (ref 0.0–149.0)
VLDL: 31.4 mg/dL (ref 0.0–40.0)

## 2023-12-19 LAB — HEMOGLOBIN A1C: Hgb A1c MFr Bld: 5.5 % (ref 4.6–6.5)

## 2023-12-19 LAB — VITAMIN D 25 HYDROXY (VIT D DEFICIENCY, FRACTURES): VITD: 28.6 ng/mL — ABNORMAL LOW (ref 30.00–100.00)

## 2023-12-19 MED ORDER — VITAMIN D (ERGOCALCIFEROL) 1.25 MG (50000 UNIT) PO CAPS: 50000.0000 [IU] | ORAL_CAPSULE | ORAL | 0 refills | Status: DC

## 2023-12-19 MED ORDER — SEMAGLUTIDE-WEIGHT MANAGEMENT 0.25 MG/0.5ML ~~LOC~~ SOAJ: 0.2500 mg | SUBCUTANEOUS | 0 refills | Status: DC

## 2023-12-19 MED ORDER — SEMAGLUTIDE-WEIGHT MANAGEMENT 0.5 MG/0.5ML ~~LOC~~ SOAJ: 0.5000 mg | SUBCUTANEOUS | 0 refills | Status: DC
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
## 2023-12-20 ENCOUNTER — Other Ambulatory Visit: Payer: Self-pay | Admitting: Family Medicine

## 2023-12-20 DIAGNOSIS — E559 Vitamin D deficiency, unspecified: Secondary | ICD-10-CM

## 2023-12-31 ENCOUNTER — Other Ambulatory Visit: Payer: Self-pay | Admitting: Family Medicine

## 2023-12-31 DIAGNOSIS — R7303 Prediabetes: Secondary | ICD-10-CM

## 2023-12-31 DIAGNOSIS — E66812 Obesity, class 2: Secondary | ICD-10-CM

## 2023-12-31 NOTE — Telephone Encounter (Signed)
 Copied from CRM [id number]. Topic: Clinical - Prescription Issue >> Dec 31, 2023  9:02 AM Juluis Ok wrote: Reason for CRM: Patient states that medication, Wegovy  was sent to incorrect pharmacy. He request that medication be sent to [facility] on [address]

## 2024-01-01 ENCOUNTER — Ambulatory Visit (INDEPENDENT_AMBULATORY_CARE_PROVIDER_SITE_OTHER): Admitting: Physical Medicine and Rehabilitation

## 2024-01-01 ENCOUNTER — Encounter: Payer: Self-pay | Admitting: Physical Medicine and Rehabilitation

## 2024-01-01 DIAGNOSIS — M545 Low back pain, unspecified: Secondary | ICD-10-CM

## 2024-01-01 DIAGNOSIS — M47816 Spondylosis without myelopathy or radiculopathy, lumbar region: Secondary | ICD-10-CM | POA: Diagnosis not present

## 2024-01-01 DIAGNOSIS — M542 Cervicalgia: Secondary | ICD-10-CM

## 2024-01-01 DIAGNOSIS — M47819 Spondylosis without myelopathy or radiculopathy, site unspecified: Secondary | ICD-10-CM | POA: Diagnosis not present

## 2024-01-01 DIAGNOSIS — G8929 Other chronic pain: Secondary | ICD-10-CM

## 2024-01-01 NOTE — Progress Notes (Signed)
 Pain Scale   Average Pain 4 Patient advising his lower back wakes him up at night and he has to get up and move to ease the pain.patient also advising that he has spurs in his neck that cause issues at times.        +Driver, -BT, -Dye Allergies.
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
## 2024-01-02 MED ORDER — SEMAGLUTIDE-WEIGHT MANAGEMENT 0.25 MG/0.5ML ~~LOC~~ SOAJ: 0.2500 mg | SUBCUTANEOUS | 0 refills | Status: AC

## 2024-01-02 MED ORDER — SEMAGLUTIDE-WEIGHT MANAGEMENT 0.5 MG/0.5ML ~~LOC~~ SOAJ: 0.5000 mg | SUBCUTANEOUS | 0 refills | Status: AC

## 2024-01-28 ENCOUNTER — Encounter: Payer: Self-pay | Admitting: Physical Medicine and Rehabilitation

## 2024-01-30 ENCOUNTER — Ambulatory Visit
Admission: RE | Admit: 2024-01-30 | Discharge: 2024-01-30 | Disposition: A | Discharge status: Home / Self Care | Source: Ambulatory Visit | Attending: Physical Medicine and Rehabilitation | Admitting: Physical Medicine and Rehabilitation

## 2024-01-30 DIAGNOSIS — M545 Other chronic pain: Secondary | ICD-10-CM

## 2024-01-30 DIAGNOSIS — M47816 Spondylosis without myelopathy or radiculopathy, lumbar region: Secondary | ICD-10-CM

## 2024-01-30 DIAGNOSIS — M47819 Spondylosis without myelopathy or radiculopathy, site unspecified: Secondary | ICD-10-CM

## 2024-01-30 DIAGNOSIS — M542 Cervicalgia: Secondary | ICD-10-CM

## 2024-02-20 ENCOUNTER — Encounter: Payer: Self-pay | Admitting: Family Medicine

## 2024-02-20 ENCOUNTER — Ambulatory Visit (INDEPENDENT_AMBULATORY_CARE_PROVIDER_SITE_OTHER): Admitting: Family Medicine

## 2024-02-20 VITALS — BP 122/80 | HR 97 | Temp 98.7°F | Ht 74.0 in | Wt 298.6 lb

## 2024-02-20 DIAGNOSIS — E66812 Obesity, class 2: Secondary | ICD-10-CM | POA: Diagnosis not present

## 2024-02-20 DIAGNOSIS — Z6838 Body mass index (BMI) 38.0-38.9, adult: Secondary | ICD-10-CM

## 2024-02-20 DIAGNOSIS — R7303 Prediabetes: Secondary | ICD-10-CM | POA: Diagnosis not present

## 2024-02-20 DIAGNOSIS — E782 Mixed hyperlipidemia: Secondary | ICD-10-CM | POA: Diagnosis not present

## 2024-02-20 DIAGNOSIS — R202 Paresthesia of skin: Secondary | ICD-10-CM

## 2024-02-20 DIAGNOSIS — E559 Vitamin D deficiency, unspecified: Secondary | ICD-10-CM

## 2024-02-20 MED ORDER — CYANOCOBALAMIN 1000 MCG/ML IJ SOLN: 1000.0000 ug | Freq: Once | INTRAMUSCULAR | Status: AC
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
## 2024-02-26 ENCOUNTER — Other Ambulatory Visit: Payer: Self-pay | Admitting: Endocrinology

## 2024-03-02 DIAGNOSIS — Z1211 Encounter for screening for malignant neoplasm of colon: Secondary | ICD-10-CM | POA: Diagnosis not present

## 2024-03-04 ENCOUNTER — Other Ambulatory Visit: Payer: Self-pay | Admitting: "Endocrinology

## 2024-03-04 ENCOUNTER — Other Ambulatory Visit: Payer: Self-pay

## 2024-03-04 DIAGNOSIS — E291 Testicular hypofunction: Secondary | ICD-10-CM

## 2024-03-04 MED ORDER — SILDENAFIL CITRATE 100 MG PO TABS: 100.0000 mg | ORAL_TABLET | ORAL | 2 refills | Status: DC | PRN

## 2024-03-05 ENCOUNTER — Other Ambulatory Visit: Payer: Self-pay

## 2024-03-09 LAB — COLOGUARD: COLOGUARD: NEGATIVE

## 2024-03-11 ENCOUNTER — Other Ambulatory Visit: Payer: Self-pay

## 2024-03-11 ENCOUNTER — Ambulatory Visit: Payer: Self-pay | Admitting: Family Medicine

## 2024-03-12 ENCOUNTER — Ambulatory Visit (INDEPENDENT_AMBULATORY_CARE_PROVIDER_SITE_OTHER): Admitting: Physical Medicine and Rehabilitation

## 2024-03-12 ENCOUNTER — Encounter: Payer: Self-pay | Admitting: Physical Medicine and Rehabilitation

## 2024-03-12 DIAGNOSIS — M47819 Spondylosis without myelopathy or radiculopathy, site unspecified: Secondary | ICD-10-CM | POA: Diagnosis not present

## 2024-03-12 DIAGNOSIS — M545 Low back pain, unspecified: Secondary | ICD-10-CM

## 2024-03-12 DIAGNOSIS — M47816 Spondylosis without myelopathy or radiculopathy, lumbar region: Secondary | ICD-10-CM

## 2024-03-12 DIAGNOSIS — G8929 Other chronic pain: Secondary | ICD-10-CM

## 2024-03-12 MED ORDER — MELOXICAM 15 MG PO TABS: 15.0000 mg | ORAL_TABLET | Freq: Every day | ORAL | 0 refills | Status: DC

## 2024-03-12 NOTE — Progress Notes (Signed)
 Pain Scale   Average Pain 4 Patient advising his lower back pain is now radiating to middle back on right side. Patient her for MRI review.        +Driver, -BT, -Dye Allergies.
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
## 2024-03-13 ENCOUNTER — Other Ambulatory Visit: Payer: Self-pay | Admitting: Family Medicine

## 2024-03-13 DIAGNOSIS — E559 Vitamin D deficiency, unspecified: Secondary | ICD-10-CM
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
## 2024-03-29 ENCOUNTER — Ambulatory Visit (INDEPENDENT_AMBULATORY_CARE_PROVIDER_SITE_OTHER)

## 2024-03-29 DIAGNOSIS — M5459 Other low back pain: Secondary | ICD-10-CM

## 2024-03-29 DIAGNOSIS — R2689 Other abnormalities of gait and mobility: Secondary | ICD-10-CM | POA: Diagnosis not present

## 2024-03-29 DIAGNOSIS — R293 Abnormal posture: Secondary | ICD-10-CM | POA: Diagnosis not present

## 2024-03-29 DIAGNOSIS — M6281 Muscle weakness (generalized): Secondary | ICD-10-CM

## 2024-04-08 ENCOUNTER — Other Ambulatory Visit: Payer: Self-pay | Admitting: Physical Medicine and Rehabilitation

## 2024-04-12 ENCOUNTER — Other Ambulatory Visit: Payer: Self-pay
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
## 2024-04-13 ENCOUNTER — Encounter

## 2024-04-15 ENCOUNTER — Other Ambulatory Visit: Payer: Self-pay | Admitting: Endocrinology

## 2024-04-19 ENCOUNTER — Telehealth: Payer: Self-pay

## 2024-04-19 NOTE — Telephone Encounter (Signed)
 Patient was a no-show for appointment on 04/13/2024. PT called patient and left voicemail to remind him of attendance policy as well as upcoming appointment on 04/21/2024 at 0800.   Susannah Daring, PT, DPT 04/19/24 2:12 PM

## 2024-04-20 ENCOUNTER — Other Ambulatory Visit: Payer: Self-pay | Admitting: Endocrinology

## 2024-04-20 ENCOUNTER — Other Ambulatory Visit: Payer: Self-pay

## 2024-04-20 ENCOUNTER — Other Ambulatory Visit: Payer: BC Managed Care – PPO

## 2024-04-20 DIAGNOSIS — E230 Hypopituitarism: Secondary | ICD-10-CM
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
## 2024-04-21 ENCOUNTER — Ambulatory Visit (INDEPENDENT_AMBULATORY_CARE_PROVIDER_SITE_OTHER)

## 2024-04-21 DIAGNOSIS — M5459 Other low back pain: Secondary | ICD-10-CM | POA: Diagnosis not present

## 2024-04-21 DIAGNOSIS — M6281 Muscle weakness (generalized): Secondary | ICD-10-CM

## 2024-04-21 DIAGNOSIS — R293 Abnormal posture: Secondary | ICD-10-CM

## 2024-04-21 DIAGNOSIS — R2689 Other abnormalities of gait and mobility: Secondary | ICD-10-CM | POA: Diagnosis not present

## 2024-04-23 ENCOUNTER — Ambulatory Visit: Payer: BC Managed Care – PPO | Admitting: Endocrinology

## 2024-04-23 ENCOUNTER — Encounter: Payer: Self-pay | Admitting: Endocrinology

## 2024-04-23 VITALS — BP 124/82 | HR 69 | Resp 20 | Ht 74.0 in | Wt 284.0 lb

## 2024-04-23 DIAGNOSIS — E230 Hypopituitarism: Secondary | ICD-10-CM

## 2024-04-23 DIAGNOSIS — Z5181 Encounter for therapeutic drug level monitoring: Secondary | ICD-10-CM | POA: Diagnosis not present
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
## 2024-04-24 LAB — TESTOSTERONE, TOTAL, LC/MS/MS: Testosterone, Total, LC-MS-MS: 566 ng/dL (ref 250–1100)

## 2024-04-27 ENCOUNTER — Ambulatory Visit: Payer: Self-pay | Admitting: Endocrinology
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
## 2024-04-28 ENCOUNTER — Encounter
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
## 2024-05-05 ENCOUNTER — Ambulatory Visit (INDEPENDENT_AMBULATORY_CARE_PROVIDER_SITE_OTHER)

## 2024-05-05 DIAGNOSIS — R293 Abnormal posture: Secondary | ICD-10-CM | POA: Diagnosis not present

## 2024-05-05 DIAGNOSIS — M5459 Other low back pain: Secondary | ICD-10-CM

## 2024-05-05 DIAGNOSIS — M6281 Muscle weakness (generalized): Secondary | ICD-10-CM | POA: Diagnosis not present

## 2024-05-05 DIAGNOSIS — R2689 Other abnormalities of gait and mobility: Secondary | ICD-10-CM

## 2024-05-06 ENCOUNTER — Other Ambulatory Visit: Payer: Self-pay

## 2024-05-06 ENCOUNTER — Ambulatory Visit (INDEPENDENT_AMBULATORY_CARE_PROVIDER_SITE_OTHER): Admitting: Physical Medicine and Rehabilitation

## 2024-05-06 VITALS — BP 133/88 | HR 71

## 2024-05-06 DIAGNOSIS — M47816 Spondylosis without myelopathy or radiculopathy, lumbar region: Secondary | ICD-10-CM | POA: Diagnosis not present

## 2024-05-06 MED ORDER — METHYLPREDNISOLONE ACETATE 40 MG/ML IJ SUSP
40.0000 mg | Freq: Once | INTRAMUSCULAR | Status: AC
  Administered 2024-05-06: 40 mg

## 2024-05-06 NOTE — Progress Notes (Signed)
 Joe Francis - 53 y.o. male MRN [id number]  Date of birth: Dec 01, 1970  Office Visit Note: Visit Date: 05/06/2024 PCP: Mercer Clotilda SAUNDERS, MD Referred by: Mercer Clotilda SAUNDERS, MD  Subjective: Chief Complaint  Patient presents with   Lower Back - Pain   HPI:  Joe Francis is a 53 y.o. male who comes in today at the request of Duwaine Pouch, FNP for planned Right  L4-5 Lumbar facet/medial branch block with fluoroscopic guidance.  The patient has failed conservative care including home exercise, medications, time and activity modification.  This injection will be diagnostic and hopefully therapeutic.  Please see requesting physician notes for further details and justification.  Exam has shown concordant pain with facet joint loading.   ROS Otherwise per HPI.  Assessment & Plan: Visit Diagnoses:    ICD-10-CM   1. Spondylosis without myelopathy or radiculopathy, lumbar region  M47.816 XR C-ARM NO REPORT    Facet Injection    methylPREDNISolone  acetate (DEPO-MEDROL ) injection 40 mg      Plan: No additional findings.   Meds & Orders:  Meds ordered this encounter  Medications   methylPREDNISolone  acetate (DEPO-MEDROL ) injection 40 mg    Orders Placed This Encounter  Procedures   Facet Injection   XR C-ARM NO REPORT    Follow-up: Return for visit to requesting provider as needed.   Procedures: No procedures performed  Lumbar Facet Joint Intra-Articular Injection(s) with Fluoroscopic Guidance  Patient: Joe Francis      Date of Birth: [birth year] MRN: [id number] PCP: Mercer Clotilda SAUNDERS, MD      Visit Date: 05/06/2024   Universal Protocol:    Date/Time: 05/06/2024  Consent Given By: the patient  Position: PRONE   Additional Comments: Vital signs were monitored before and after the procedure. Patient was prepped and draped in the usual sterile fashion. The correct patient, procedure, and site was verified.   Injection Procedure Details:  Procedure Site One Meds  Administered:  Meds ordered this encounter  Medications   methylPREDNISolone  acetate (DEPO-MEDROL ) injection 40 mg     Laterality: Right  Location/Site:  L4-L5  Needle size: 22 guage  Needle type: Spinal  Needle Placement: Articular  Findings:  -Comments: Excellent flow of contrast producing a partial arthrogram.  Procedure Details: The fluoroscope beam is vertically oriented in AP, and the inferior recess is visualized beneath the lower pole of the inferior apophyseal process, which represents the target point for needle insertion. When direct visualization is difficult the target point is located at the medial projection of the vertebral pedicle. The region overlying each aforementioned target is locally anesthetized with a 1 to 2 ml. volume of 1% Lidocaine  without Epinephrine.   The spinal needle was inserted into each of the above mentioned facet joints using biplanar fluoroscopic guidance. A 0.25 to 0.5 ml. volume of Isovue -250 was injected and a partial facet joint arthrogram was obtained. A single spot film was obtained of the resulting arthrogram.    One to 1.25 ml of the steroid/anesthetic solution was then injected into each of the facet joints noted above.   Additional Comments:  The patient tolerated the procedure well Dressing: 2 x 2 sterile gauze and Band-Aid    Post-procedure details: Patient was observed during the procedure. Post-procedure instructions were reviewed.  Patient left the clinic in stable condition.    Clinical History: EXAM DESCRIPTION: MR LUMBAR SPINE WO CONTRAST   CLINICAL HISTORY: Low back pain, symptoms persist with > 6 wks treatment  COMPARISON: None Available.   TECHNIQUE: MRI of the lumbar spine is performed according to our usual protocol with axial and sagittal multi sequence imaging.   FINDINGS: Mild anterolisthesis of L4 on L5 related to moderate to severe facet arthritis and chronic pars defects. Moderate degenerative disc  disease L4-5 and L5-S1. No discogenic endplate marrow edema. The marrow signal is unremarkable as well as the conus and cauda equina.   L1-2 has a small right paracentral protrusion causing mild lateral recess narrowing on the right.   L3-4 has mild foraminal narrowing on the right and mild central stenosis due to broad-based disc bulge and facet/ligament flavum hypertrophy. There is a mild to moderate central stenosis at the level of the L4 vertebral body largely related to facet/ligament flavum hypertrophy.   L4-5 has moderate bilateral foraminal narrowing greater on the left and mild bilateral lateral recess narrowing due to broad-based disc bulge and facet/ligament flavum hypertrophy.   L5-S1 has mild to moderate foraminal narrowing on the left due to broad-based disc bulge and facet hypertrophy. The image levels are otherwise unremarkable.   The imaged retroperitoneal structures are unremarkable.     IMPRESSION: Mild anterolisthesis of L4 on L5 related to facet arthritis and chronic L5 pars defects.   Multilevel degenerative spondylosis with levels described in detail above.   L5-S1 has moderate bilateral degenerative foraminal and greater on the left with suspected contact of the exiting L5 nerve.   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   Electronically signed by: Reyes Frees MD 02/17/2024 08:52 PM EDT RP Workstation: [id number]     Objective:  VS:  HT:    WT:   BMI:     BP:133/88  HR:71bpm  TEMP: ( )  RESP:  Physical Exam Vitals and nursing note reviewed.  Constitutional:      General: He is not in acute distress.    Appearance: Normal appearance. He is obese. He is not ill-appearing.  HENT:     Head: Normocephalic and atraumatic.     Right Ear: External ear normal.     Left Ear: External ear normal.     Nose: No congestion.  Eyes:      Extraocular Movements: Extraocular movements intact.  Cardiovascular:     Rate and Rhythm: Normal rate.     Pulses: Normal pulses.  Pulmonary:     Effort: Pulmonary effort is normal. No respiratory distress.  Abdominal:     General: There is no distension.     Palpations: Abdomen is soft.  Musculoskeletal:        General: Tenderness present. No signs of injury.     Cervical back: Neck supple.     Right lower leg: No edema.     Left lower leg: No edema.     Comments: Patient has good distal strength without clonus.  Skin:    Findings: No erythema or rash.  Neurological:     General: No focal deficit present.     Mental Status: He is alert and oriented to person, place, and time.     Sensory: No sensory deficit.     Motor: No weakness or abnormal muscle tone.     Coordination: Coordination normal.  Psychiatric:        Mood and Affect: Mood normal.        Behavior: Behavior normal.      Imaging: XR C-ARM NO REPORT  Result Date: 05/06/2024 Please see Notes tab for imaging impression.

## 2024-05-06 NOTE — Procedures (Signed)
 Lumbar Facet Joint Intra-Articular Injection(s) with Fluoroscopic Guidance  Patient: Joe Francis      Date of Birth: Jan 10, 1971 MRN: [id number] PCP: Mercer Clotilda SAUNDERS, MD      Visit Date: 05/06/2024   Universal Protocol:    Date/Time: 05/06/2024  Consent Given By: the patient  Position: PRONE   Additional Comments: Vital signs were monitored before and after the procedure. Patient was prepped and draped in the usual sterile fashion. The correct patient, procedure, and site was verified.   Injection Procedure Details:  Procedure Site One Meds Administered:  Meds ordered this encounter  Medications   methylPREDNISolone  acetate (DEPO-MEDROL ) injection 40 mg     Laterality: Right  Location/Site:  L4-L5  Needle size: 22 guage  Needle type: Spinal  Needle Placement: Articular  Findings:  -Comments: Excellent flow of contrast producing a partial arthrogram.  Procedure Details: The fluoroscope beam is vertically oriented in AP, and the inferior recess is visualized beneath the lower pole of the inferior apophyseal process, which represents the target point for needle insertion. When direct visualization is difficult the target point is located at the medial projection of the vertebral pedicle. The region overlying each aforementioned target is locally anesthetized with a 1 to 2 ml. volume of 1% Lidocaine  without Epinephrine.   The spinal needle was inserted into each of the above mentioned facet joints using biplanar fluoroscopic guidance. A 0.25 to 0.5 ml. volume of Isovue -250 was injected and a partial facet joint arthrogram was obtained. A single spot film was obtained of the resulting arthrogram.    One to 1.25 ml of the steroid/anesthetic solution was then injected into each of the facet joints noted above.   Additional Comments:  The patient tolerated the procedure well Dressing: 2 x 2 sterile gauze and Band-Aid    Post-procedure details: Patient was observed  during the procedure. Post-procedure instructions were reviewed.  Patient left the clinic in stable condition.

## 2024-05-06 NOTE — Progress Notes (Signed)
 Pain Scale   Average Pain 3 Patient advising he has chronic lower back pain that radiates mostly to right side, pain increases when laying down and decreases when walking and standing.        +Driver, -BT, -Dye Allergies.

## 2024-05-09 ENCOUNTER — Other Ambulatory Visit: Payer: Self-pay | Admitting: Physical Medicine and Rehabilitation
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
## 2024-05-11 ENCOUNTER — Encounter

## 2024-05-21 ENCOUNTER — Other Ambulatory Visit: Payer: Self-pay

## 2024-05-21 ENCOUNTER — Other Ambulatory Visit: Payer: Self-pay | Admitting: Endocrinology

## 2024-05-21 ENCOUNTER — Telehealth: Payer: Self-pay | Admitting: Endocrinology

## 2024-05-21 DIAGNOSIS — E230 Hypopituitarism: Secondary | ICD-10-CM

## 2024-05-21 DIAGNOSIS — E291 Testicular hypofunction: Secondary | ICD-10-CM

## 2024-05-21 MED ORDER — TESTOSTERONE CYPIONATE 200 MG/ML IM SOLN: 50.0000 mg | INTRAMUSCULAR | 1 refills | Status: AC

## 2024-05-21 MED ORDER — SILDENAFIL CITRATE 100 MG PO TABS: 100.0000 mg | ORAL_TABLET | ORAL | 2 refills | Status: DC | PRN

## 2024-05-21 NOTE — Telephone Encounter (Signed)
 MEDICATION:  1)  sildenafil  (VIAGRA ) 100 MG tablet  2)  testosterone  cypionate (DEPOTESTOSTERONE CYPIONATE) 200   PHARMACY:    [id number] - , [place] - [address] (Ph: [phone])    HAS THE PATIENT CONTACTED THEIR PHARMACY?  Yes  LAST REFILL:  @@LASTREFILL @  IS THIS A 90 DAY SUPPLY : Yes  IS PATIENT OUT OF MEDICATION: Yes  IF NOT; HOW MUCH IS LEFT:   LAST APPOINTMENT DATE: @8 /29/2025  NEXT APPOINTMENT DATE:@3 /13/2026  DO WE HAVE YOUR PERMISSION TO LEAVE A DETAILED MESSAGE?: Yes  OTHER COMMENTS:    **Let patient know to contact pharmacy at the end of the day to make sure medication is ready. **  ** Please notify patient to allow 48-72 hours to process**  **Encourage patient to contact the pharmacy for refills or they can request refills through [facility]**

## 2024-06-08 ENCOUNTER — Other Ambulatory Visit: Payer: Self-pay | Admitting: Physical Medicine and Rehabilitation

## 2024-06-11 ENCOUNTER — Other Ambulatory Visit: Payer: Self-pay

## 2024-06-11 DIAGNOSIS — E291 Testicular hypofunction: Secondary | ICD-10-CM

## 2024-06-11 NOTE — Telephone Encounter (Signed)
 Testosterone  and Viagra  refill requested. Viagra  sent and Testosterone  RX routed to MD on call to refill.

## 2024-06-21 ENCOUNTER — Ambulatory Visit: Admitting: Family Medicine

## 2024-06-25 ENCOUNTER — Encounter: Payer: Self-pay | Admitting: Family Medicine

## 2024-06-25 ENCOUNTER — Ambulatory Visit: Admitting: Family Medicine

## 2024-06-25 VITALS — BP 124/82 | HR 75 | Temp 98.6°F | Ht 74.0 in | Wt 285.6 lb

## 2024-06-25 DIAGNOSIS — Z6836 Body mass index (BMI) 36.0-36.9, adult: Secondary | ICD-10-CM | POA: Diagnosis not present

## 2024-06-25 DIAGNOSIS — E66812 Obesity, class 2: Secondary | ICD-10-CM | POA: Diagnosis not present

## 2024-06-25 DIAGNOSIS — D229 Melanocytic nevi, unspecified: Secondary | ICD-10-CM

## 2024-06-25 DIAGNOSIS — R202 Paresthesia of skin: Secondary | ICD-10-CM | POA: Diagnosis not present

## 2024-06-25 DIAGNOSIS — Z6838 Body mass index (BMI) 38.0-38.9, adult: Secondary | ICD-10-CM | POA: Diagnosis not present

## 2024-06-25 DIAGNOSIS — R600 Localized edema: Secondary | ICD-10-CM | POA: Diagnosis not present

## 2024-06-25 DIAGNOSIS — Z23 Encounter for immunization: Secondary | ICD-10-CM

## 2024-06-25 DIAGNOSIS — H938X1 Other specified disorders of right ear: Secondary | ICD-10-CM | POA: Diagnosis not present

## 2024-06-25 LAB — COMPREHENSIVE METABOLIC PANEL WITH GFR
ALT: 26 U/L (ref 0–53)
AST: 31 U/L (ref 0–37)
Albumin: 4.5 g/dL (ref 3.5–5.2)
Alkaline Phosphatase: 71 U/L (ref 39–117)
BUN: 12 mg/dL (ref 6–23)
CO2: 28 meq/L (ref 19–32)
Calcium: 9 mg/dL (ref 8.4–10.5)
Chloride: 104 meq/L (ref 96–112)
Creatinine, Ser: 1.21 mg/dL (ref 0.40–1.50)
GFR: 67.52 mL/min (ref >60.00)
Glucose, Bld: 92 mg/dL (ref 70–99)
Potassium: 4.4 meq/L (ref 3.5–5.1)
Sodium: 138 meq/L (ref 135–145)
Total Bilirubin: 0.5 mg/dL (ref 0.2–1.2)
Total Protein: 7.1 g/dL (ref 6.0–8.3)

## 2024-06-25 LAB — CBC WITH DIFFERENTIAL/PLATELET
Basophils Absolute: 0 K/uL (ref 0.0–0.1)
Basophils Relative: 0.5 % (ref 0.0–3.0)
Eosinophils Absolute: 0.1 K/uL (ref 0.0–0.7)
Eosinophils Relative: 0.8 % (ref 0.0–5.0)
HCT: 43.2 % (ref 39.0–52.0)
Hemoglobin: 13.9 g/dL (ref 13.0–17.0)
Lymphocytes Relative: 13.8 % (ref 12.0–46.0)
Lymphs Abs: 1 K/uL (ref 0.7–4.0)
MCHC: 32.2 g/dL (ref 30.0–36.0)
MCV: 77.2 fl — ABNORMAL LOW (ref 78.0–100.0)
Monocytes Absolute: 0.5 K/uL (ref 0.1–1.0)
Monocytes Relative: 7 % (ref 3.0–12.0)
Neutro Abs: 5.8 K/uL (ref 1.4–7.7)
Neutrophils Relative %: 77.9 % — ABNORMAL HIGH (ref 43.0–77.0)
Platelets: 275 K/uL (ref 150.0–400.0)
RBC: 5.59 Mil/uL (ref 4.22–5.81)
RDW: 17.8 % — ABNORMAL HIGH (ref 11.5–15.5)
WBC: 7.5 K/uL (ref 4.0–10.5)

## 2024-06-25 LAB — VITAMIN B12: Vitamin B-12: 586 pg/mL (ref 211–911)

## 2024-06-25 LAB — BRAIN NATRIURETIC PEPTIDE: Pro B Natriuretic peptide (BNP): 8 pg/mL (ref 0.0–100.0)

## 2024-06-25 LAB — FOLATE: Folate: 10.2 ng/mL (ref >5.9)

## 2024-06-25 MED ORDER — GABAPENTIN 100 MG PO CAPS: 100.0000 mg | ORAL_CAPSULE | Freq: Every day | ORAL | 3 refills | Status: AC
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
## 2024-06-26 LAB — D-DIMER, QUANTITATIVE: D-Dimer, Quant: 0.36 ug{FEU}/mL (ref <0.50)

## 2024-06-26 LAB — IRON,TIBC AND FERRITIN PANEL
%SAT: 7 % — ABNORMAL LOW (ref 20–48)
Ferritin: 7 ng/mL — ABNORMAL LOW (ref 38–380)
Iron: 31 ug/dL — ABNORMAL LOW (ref 50–180)
TIBC: 454 ug/dL — ABNORMAL HIGH (ref 250–425)

## 2024-06-28 ENCOUNTER — Encounter: Payer: Self-pay | Admitting: Radiology

## 2024-06-30 ENCOUNTER — Ambulatory Visit: Payer: Self-pay | Admitting: Family Medicine

## 2024-06-30 DIAGNOSIS — E611 Iron deficiency: Secondary | ICD-10-CM

## 2024-06-30 MED ORDER — FERROUS GLUCONATE 324 (38 FE) MG PO TABS: 324.0000 mg | ORAL_TABLET | Freq: Every day | ORAL | 1 refills | Status: AC

## 2024-10-21 ENCOUNTER — Other Ambulatory Visit

## 2024-10-22 ENCOUNTER — Ambulatory Visit: Admitting: Family Medicine

## 2024-10-26 ENCOUNTER — Other Ambulatory Visit

## 2024-10-29 ENCOUNTER — Ambulatory Visit: Admitting: Endocrinology

## 2024-11-05 ENCOUNTER — Ambulatory Visit: Admitting: Endocrinology

## 2024-11-19 ENCOUNTER — Other Ambulatory Visit

## 2024-12-02 ENCOUNTER — Ambulatory Visit: Admitting: Endocrinology
